# Patient Record
Sex: Male | Born: 1960 | Race: Black or African American | Hispanic: No | Marital: Married | Smoking: Never smoker
Health system: Southern US, Community
[De-identification: ages and names within clinical notes are randomized; demographics above are authoritative.]

## PROBLEM LIST (undated history)

## (undated) DIAGNOSIS — H9192 Unspecified hearing loss, left ear: Secondary | ICD-10-CM

## (undated) DIAGNOSIS — I10 Essential (primary) hypertension: Secondary | ICD-10-CM

## (undated) DIAGNOSIS — K219 Gastro-esophageal reflux disease without esophagitis: Secondary | ICD-10-CM

## (undated) DIAGNOSIS — G4733 Obstructive sleep apnea (adult) (pediatric): Secondary | ICD-10-CM

## (undated) HISTORY — PX: APPENDECTOMY: SHX54

## (undated) HISTORY — PX: CHOLECYSTECTOMY: SHX55

---

## 1998-01-21 ENCOUNTER — Emergency Department (HOSPITAL_COMMUNITY): Admission: EM | Admit: 1998-01-21 | Discharge: 1998-01-21 | Payer: Self-pay | Admitting: Emergency Medicine

## 1998-05-11 ENCOUNTER — Emergency Department (HOSPITAL_COMMUNITY): Admission: EM | Admit: 1998-05-11 | Discharge: 1998-05-11 | Payer: Self-pay | Admitting: Emergency Medicine

## 1999-10-05 ENCOUNTER — Emergency Department (HOSPITAL_COMMUNITY): Admission: EM | Admit: 1999-10-05 | Discharge: 1999-10-05 | Payer: Self-pay | Admitting: Emergency Medicine

## 2000-09-14 ENCOUNTER — Encounter: Payer: Self-pay | Admitting: Emergency Medicine

## 2000-09-14 ENCOUNTER — Emergency Department (HOSPITAL_COMMUNITY): Admission: EM | Admit: 2000-09-14 | Discharge: 2000-09-14 | Payer: Self-pay | Admitting: Emergency Medicine

## 2003-04-01 ENCOUNTER — Emergency Department (HOSPITAL_COMMUNITY): Admission: EM | Admit: 2003-04-01 | Discharge: 2003-04-01 | Payer: Self-pay | Admitting: *Deleted

## 2003-04-01 ENCOUNTER — Encounter: Payer: Self-pay | Admitting: *Deleted

## 2004-12-14 ENCOUNTER — Emergency Department (HOSPITAL_COMMUNITY): Admission: EM | Admit: 2004-12-14 | Discharge: 2004-12-14 | Payer: Self-pay | Admitting: *Deleted

## 2005-02-07 ENCOUNTER — Ambulatory Visit: Payer: Self-pay | Admitting: Internal Medicine

## 2005-02-07 ENCOUNTER — Inpatient Hospital Stay (HOSPITAL_COMMUNITY): Admission: EM | Admit: 2005-02-07 | Discharge: 2005-02-11 | Payer: Self-pay | Admitting: Emergency Medicine

## 2005-02-08 ENCOUNTER — Encounter (INDEPENDENT_AMBULATORY_CARE_PROVIDER_SITE_OTHER): Payer: Self-pay | Admitting: *Deleted

## 2005-02-09 ENCOUNTER — Ambulatory Visit: Payer: Self-pay | Admitting: Cardiology

## 2005-02-17 ENCOUNTER — Ambulatory Visit: Payer: Self-pay | Admitting: Internal Medicine

## 2005-02-23 ENCOUNTER — Ambulatory Visit: Payer: Self-pay | Admitting: Cardiology

## 2008-04-28 ENCOUNTER — Emergency Department (HOSPITAL_COMMUNITY): Admission: EM | Admit: 2008-04-28 | Discharge: 2008-04-28 | Payer: Self-pay | Admitting: Family Medicine

## 2010-03-01 ENCOUNTER — Inpatient Hospital Stay (HOSPITAL_COMMUNITY): Admission: EM | Admit: 2010-03-01 | Discharge: 2010-03-03 | Payer: Self-pay | Admitting: Emergency Medicine

## 2010-03-01 ENCOUNTER — Ambulatory Visit: Payer: Self-pay | Admitting: Internal Medicine

## 2010-03-19 DIAGNOSIS — I10 Essential (primary) hypertension: Secondary | ICD-10-CM | POA: Insufficient documentation

## 2010-03-19 DIAGNOSIS — E785 Hyperlipidemia, unspecified: Secondary | ICD-10-CM | POA: Insufficient documentation

## 2010-10-20 ENCOUNTER — Inpatient Hospital Stay (INDEPENDENT_AMBULATORY_CARE_PROVIDER_SITE_OTHER)
Admission: RE | Admit: 2010-10-20 | Discharge: 2010-10-20 | Disposition: A | Discharge status: Home / Self Care | Payer: BC Managed Care – PPO | Source: Ambulatory Visit | Attending: Family Medicine | Admitting: Family Medicine

## 2010-10-20 DIAGNOSIS — R42 Dizziness and giddiness: Secondary | ICD-10-CM

## 2010-10-20 LAB — POCT I-STAT, CHEM 8
Calcium, Ion: 1.22 mmol/L (ref 1.12–1.32)
Chloride: 104 mEq/L (ref 96–112)
Creatinine, Ser: 1.4 mg/dL (ref 0.4–1.5)
Glucose, Bld: 97 mg/dL (ref 70–99)
Sodium: 140 mEq/L (ref 135–145)

## 2010-11-15 LAB — COMPREHENSIVE METABOLIC PANEL
BUN: 15 mg/dL (ref 6–23)
CO2: 27 mEq/L (ref 19–32)
GFR calc Af Amer: >60 mL/min (ref >60)
Glucose, Bld: 105 mg/dL — ABNORMAL HIGH (ref 70–99)
Potassium: 4.1 mEq/L (ref 3.5–5.1)
Sodium: 141 mEq/L (ref 135–145)
Total Protein: 7.2 g/dL (ref 6.0–8.3)

## 2010-11-15 LAB — BASIC METABOLIC PANEL
BUN: 13 mg/dL (ref 6–23)
CO2: 27 mEq/L (ref 19–32)
Calcium: 8.9 mg/dL (ref 8.4–10.5)
Creatinine, Ser: 1.14 mg/dL (ref 0.4–1.5)
GFR calc non Af Amer: >60 mL/min (ref >60)
Glucose, Bld: 112 mg/dL — ABNORMAL HIGH (ref 70–99)
Glucose, Bld: 112 mg/dL — ABNORMAL HIGH (ref 70–99)
Potassium: 4 mEq/L (ref 3.5–5.1)
Sodium: 139 mEq/L (ref 135–145)
Sodium: 140 mEq/L (ref 135–145)

## 2010-11-15 LAB — CBC
Hemoglobin: 13.1 g/dL (ref 13.0–17.0)
MCH: 27.5 pg (ref 26.0–34.0)
MCV: 83.6 fL (ref 78.0–100.0)
MCV: 83.9 fL (ref 78.0–100.0)
Platelets: 247 10*3/uL (ref 150–400)
RBC: 4.55 MIL/uL (ref 4.22–5.81)
RDW: 13.7 % (ref 11.5–15.5)
RDW: 14.1 % (ref 11.5–15.5)
WBC: 7.4 10*3/uL (ref 4.0–10.5)
WBC: 8 10*3/uL (ref 4.0–10.5)

## 2010-11-15 LAB — DIFFERENTIAL
Basophils Absolute: 0.1 10*3/uL (ref 0.0–0.1)
Basophils Relative: 1 % (ref 0–1)
Eosinophils Relative: 2 % (ref 0–5)
Lymphs Abs: 3.4 10*3/uL (ref 0.7–4.0)
Monocytes Relative: 7 % (ref 3–12)
Neutrophils Relative %: 47 % (ref 43–77)

## 2010-11-15 LAB — APTT: aPTT: 83 seconds — ABNORMAL HIGH (ref 24–37)

## 2010-11-15 LAB — CK TOTAL AND CKMB (NOT AT ARMC): CK, MB: 5.1 ng/mL — ABNORMAL HIGH (ref 0.3–4.0)

## 2010-11-15 LAB — LIPASE, BLOOD: Lipase: 34 U/L (ref 11–59)

## 2010-11-15 LAB — PROTIME-INR
INR: 1.06 (ref 0.00–1.49)
Prothrombin Time: 13.7 seconds (ref 11.6–15.2)

## 2010-11-15 LAB — TROPONIN I: Troponin I: 0.03 ng/mL (ref 0.00–0.06)

## 2010-11-15 LAB — POCT CARDIAC MARKERS
CKMB, poc: 1.7 ng/mL (ref 1.0–8.0)
Troponin i, poc: <0.05 ng/mL (ref 0.00–0.09)

## 2010-11-15 LAB — TSH: TSH: 1.015 u[IU]/mL (ref 0.350–4.500)

## 2011-01-15 NOTE — Consult Note (Signed)
Blake Wolfe, Blake Wolfe              ACCOUNT NO.:  [id number]   MEDICAL RECORD NO.:  [id number]          PATIENT TYPE:  INP   LOCATION:  3733                         FACILITY:  [facility]   PHYSICIAN:  Doylene Canning. Ladona Ridgel, M.D.  DATE OF BIRTH:  11/15/1960   DATE OF CONSULTATION:  02/11/2005  DATE OF DISCHARGE:                                   CONSULTATION   CONSULTATION REQUESTED BY:  Dr. Bonnee Quin   INDICATION FOR CONSULTATION:  Evaluation of recurrent unexplained syncope.   HISTORY OF PRESENT ILLNESS:  The patient is a 50 year old man with a history  of hypertension and dyslipidemia.  He was admitted to the hospital after  having a near syncopal episode.  He gives a history which initially began  seven years ago.  At that time, he had been sleeping and got up and suddenly  became light-headed and passed out.  There is a brief warning with this  episode, but he had no incontinence.  He had no episodes until one month  ago, when he was awakened during sleep and went to the bathroom.  Immediately after urinating, he stood up to go back to the bedroom and  became dizzy and light headed and passed out.  No one actually saw the  patient passed out.  However, he found himself on the floor and his wife  stated that she heard him fall to the floor and did not answer when she  called.  He did not lose control of his bowel or bladder with his syncopal  episode and did not bite his tongue.  There was only a brief warning that  something was wrong.  After he passed out, he awoke fairly quickly and did  not feel particularly bad.  The most recent episode did not result in frank  syncope, but near syncope occurred on the day prior to admission.  Again, he  was shaving in his bathroom and began to feel dizzy and light headed and  subsequently sat down and avoided passing out.  He had no post nausea or  vomiting.  The spell was avoided by sitting down.  He decided to present for  additional evaluation.   The patient does have a history of chest tightness  and subsequently underwent catheterization by Dr. Riley Kill, demonstrating  normal coronary arteries and normal LV systolic function.  His  echocardiogram demonstrated no evidence of hypertrophic cardiomyopathy   PAST MEDICAL HISTORY:  1.  Hypertension.  2.  Dyslipidemia.   FAMILY HISTORY:  Negative for sudden cardiac death.  His mother died at age  80 of complications of diabetes and hypertension and coronary disease.  His  father died at age 39 of complications of prostate cancer.  He had 13  siblings, none of whom have died suddenly.   SOCIAL HISTORY:  The patient lives in [place] and is married.  He works  as a Naval architect.  He drinks alcohol socially and denies tobacco use.  He  quit smoking in February of this year.   REVIEW OF SYSTEMS:  Negative for fever, chills and night seats.  He  has no  vision or hearing problems.  No skin rashes or other problems.  No shortness  of breath, orthopnea or PND.  He has chest pain as previously described.  He  denies dysuria, hematuria or nocturia.  He denies weakness, numbness,  depression and anxiety.  He denies nausea, vomiting, diarrhea and  constipation.  He does have reflux symptoms.  He denies polyuria,  polydipsia, heat or cold intolerance.  He denies arthralgias and arthritis.   PHYSICAL EXAMINATION:  GENERAL:  He is a pleasant, well-appearing man in no  distress.  VITAL SIGNS:  Blood pressure 121/83, pulse 80 and regular, respirations 20,  temperature 98.  HEENT:  Exam is normocephalic and atraumatic.  Pupils equal and round.  Oropharynx is moist.  Sclerae are anicteric.  NECK:  No jugular venous distention.  There is no thyromegaly.  The trachea  is midline.  The carotids are 2+ and symmetric.  There is no adenopathy.  LUNGS:  Clear bilaterally to auscultation.  There are no wheezes, rales or  rhonchi present.  There is no increased work of breathing.  CARDIOVASCULAR:   Regular rate and rhythm, normal S1 and S2.  There are no  murmurs, rubs or gallops present.  ABDOMEN:  Soft, nontender and nondistended.  There is no organomegaly.  EXTREMITIES:  Demonstrate no clubbing, cyanosis or edema.  NEUROLOGIC:  Alert and oriented x3 with cranial nerves intact.  Strength was  5/5 and symmetric.   His EKG demonstrates sinus rhythm with normal axis and intervals.   IMPRESSION:  1.  Recurrent unexplained syncope.  2.  Hypertension.  3.  Dyslipidemia.   DISCUSSION:  The most likely episode of the patient's symptoms are neurally  mediated.  He has no evidence of structural heart disease, no family history  of sudden cardiac death, and no evidence of significant QT prolongation on  his EKG.  I have recommended that he increase his salt and fluid intake,  that he have frequent meals and that he sits down or lies down when a spell  occurs.  If the all of above do no control his symptoms, then a trial of  Florinef plus or minus a centrally acting agent like Zoloft or Lexapro will  be a consideration. I plan to see the patient back in the office in six to  eight weeks.       GWT/MEDQ  D:  02/11/2005  T:  02/11/2005  Job:  [id number]   cc:   Arturo Morton. Riley Kill, M.D. [facility]   Ileana Roup, M.D.  [address], [place] [address]  Fax: [phone]

## 2011-01-15 NOTE — Cardiovascular Report (Signed)
Blake Wolfe, Blake Wolfe              ACCOUNT NO.:  [id number]   MEDICAL RECORD NO.:  [id number]          PATIENT TYPE:  INP   LOCATION:  3733                         FACILITY:  [facility]   PHYSICIAN:  Arturo Morton. Riley Kill, M.D. [facility] OF BIRTH:  [birth year]   DATE OF PROCEDURE:  02/10/2005  DATE OF DISCHARGE:                              CARDIAC CATHETERIZATION   INDICATIONS:  Blake Wolfe is a 50 year old gentleman who presents with  some exertional chest discomfort, but also syncope. His enzymes were  generally negative with all negative troponins. There was one borderline CK-  MB. The episodes of syncope sound as if they could be neurally mediated.  Because of the recurrent episodes of chest pain and the progression of  symptoms over the past month, cardiac catheterization was felt to be  indicated. Importantly, the patient had a D-dimer of less than 0.22. The  chest x-ray revealed borderline cardiac enlargement with no acute disease.  The patient has had prior cholecystectomy. Risks, benefits, and alternatives  were discussed with the patient, and he consented to proceed.   PROCEDURE:  1.  Left heart catheterization  2.  Selective coronary arteriography.  3.  Selective left ventriculography.   DESCRIPTION OF PROCEDURE:  The patient was brought to the catheterization  laboratory and prepped and draped in usual fashion. Through an anterior  puncture, the right femoral artery was entered on the first stick. A 6-  French sheath was then placed. Central aortic and left ventricular pressures  were measured with a pigtail catheter. Ventriculography was then performed  in the RAO projection without complication. Following pressure pullback, the  pigtail catheter was removed. Coronary arteriography of the left coronary  artery was then performed without difficulty. Coronary arteriography of the  right coronary was without major difficulty, but with there was damping with  entry of the right  ostium probably due to spasm. Intra-aortic nitroglycerin  was then administered. We took additional views. Following this, I actually  put a guiding catheter into the right coronary to better demonstrate this  area. Multiple views were obtained just outside the ostium. There was no  significant complication from the procedure, and he was taken to the holding  area in satisfactory clinical condition.   HEMODYNAMIC DATA:  1.  Central aortic pressure 118/81.  2.  Left ventricular pressure 129/11.  3.  No gradient on pullback across aortic valve.   ANGIOGRAPHIC DATA:  1.  Ventriculography was performed in the RAO projection. Overall systolic      function was preserved. No definite wall motion abnormalities were seen.      Ejection fraction was calculated in the RAO projection at 62%.  2.  The left main is free of critical disease.  3.  The left anterior descending artery courses to the apex. There two      smaller diagonal branches. Other than minimal luminal irregularity, the      LAD is free of critical disease.  4.  The circumflex provides a proximal marginal branch and then divides into      an AV circumflex which provides a posterolateral system and  a fairly      large second obtuse marginal branch. The circumflex system appears to be      relatively smooth throughout. No obvious evidence of obstruction is      identified.  5.  The right coronary artery is a relatively small vessel providing a      single PDA. There was some ostial spasm noted which appeared to be      relieved by intracoronary nitroglycerin. The right coronary artery      itself appeared to be smooth throughout. Because of the proximal spasm,      there is perhaps 20 to 30% narrowing at the ostium, but this did not      appear to be high grade or flow limiting.   CONCLUSIONS:  1.  Preserved left ventricular function.  2.  No evidence of high-grade obstructive disease.  3.  Mild ostial spasm of the right coronary  during injection.   DISPOSITION:  I spoke with Dr. Lewayne Bunting.  EP consult will be obtained.       TDS/MEDQ  D:  02/10/2005  T:  02/10/2005  Job:  [id number]   cc:   Doylene Canning. Ladona Ridgel, M.D.   [facility]   Ileana Roup, M.D.  [address], [place] [address]  Fax: [phone]

## 2011-01-15 NOTE — Consult Note (Signed)
NAMECORTLIN, Blake Wolfe              ACCOUNT NO.:  [id number]   MEDICAL RECORD NO.:  [id number]          PATIENT TYPE:  INP   LOCATION:  3733                         FACILITY:  [facility]   PHYSICIAN:  Blake Wolfe, M.D. [facility] OF BIRTH:  10-06-1960   DATE OF CONSULTATION:  02/09/2005  DATE OF DISCHARGE:                                   CONSULTATION   CHIEF COMPLAINT:  Passing out.   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 Over the past month  he has had about three separate episodes. During the first episode, he felt  weak and actually had what was described as a sinusitis. He apparently  received a course of penicillin. He was also thought to be dehydrated and  was hydrated. This gradually resolved. He then got up one night quickly  because he had to go to the bathroom. He then found himself down and he is  not quite sure how long he was out, but this occurred at the completion of  urination. The final episode occurred during shaving. He was shaving and  then became suddenly quite dizzy. He then sat down. Over the past two years  he has had some mild chest tightness. This chest tightness has actually  gotten worse over the past month. His car broke down recently and he had  about a three to four mile walk and he was feeling a fair amount of chest  tightness at the completion of this. In addition, he apparently has a heavy  hose which he says can bring on the  chest tightness. He says that it is a  deep heaviness and it is also associated with some shortness of breath. This  has been getting progressively worse. The patient is a smoker, but stopped  smoking in further evaluation of this year.   There is no prior history of diabetes or hypertension. He has a history of  tobacco use.   Prior to admission there were no medications.   He has no known drug allergies.   The patient has had  an appendectomy and cholecystectomy.   SOCIAL HISTORY:  He lives in [place] with his wife. He is a Naval architect.  He quit smoking in February 2006.   FAMILY HISTORY:  Remarkable for a mother who died at 67 and a father at 42.  There is no defined coronary heart disease.   REVIEW OF SYSTEMS:  Remarkable for some mild gastroesophageal reflux.   On admission, the patient had a head CT that was negative, ejection fraction  of 50% to 55% without a definite wall motion abnormality. The  electrocardiograms are reviewed, and initial electrocardiogram demonstrates  a normal sinus rhythm. There is mild ST elevation in I, aVL, V2,  I  __________  R wave progression, but may be due to lead placement, although  an anterior infarct could not be excluded.   His laboratory studies reveal a normal BUN and creatinine with potassium of  3.8, hemoglobin 13.6, hematocrit 41.2, and white count 5600.  The CKs have  been mildly elevated at 361, 305, and 285, but with normal MBs and  troponins. He has a cholesterol of 437, HDL 34.  TSH is normal.   PHYSICAL EXAMINATION:  GENERAL: He is an alert and oriented gentleman who  provides a clear history. He is well-developed and well-nourished with an  appropriate affect.  VITAL SIGNS: Blood pressure is 114/81.  HEENT: Extraocular muscles intact. Pupils equal, round, and reactive to  light and accommodation. No carotid bruits are appreciated.  NECK: Supple.  LUNGS: Clear to auscultation and percussion.  HEART: PMI is nondisplaced. Normal S1 and S2. No murmurs, rubs, or gallops  are appreciated.  EXTREMITIES: No significant edema.  NEUROLOGIC: Intact.   Chest x-ray reveals borderline cardiomegaly and no active disease.   1.  __________ etiology.  2.  Exertional chest tightness with some progression over the past month.   This patient has two specific problems. One is an episode of syncope or near  syncope, but it could be neurally mediated, but they have  been occurring in  the setting of exertional chest tightness. The possibility of underlying  coronary disease with possible right coronary involvement cannot be entirely  excluded. I think this needs to be excluded first. He was getting ready to  get on the treadmill today when Dr. Samule Ohm stopped his treadmill study.  Given the progression of symptoms I think he may need a tilt table test  given the findings. An absolute identifiable etiology is necessary given his  occupation.       TDS/MEDQ  D:  02/09/2005  T:  02/09/2005  Job:  [id number]

## 2011-01-15 NOTE — Discharge Summary (Signed)
Blake Wolfe, Blake Wolfe              ACCOUNT NO.:  [id number]   MEDICAL RECORD NO.:  [id number]          PATIENT TYPE:  INP   LOCATION:  3733                         FACILITY:  [facility]   PHYSICIAN:  Ileana Roup, M.D.  DATE OF BIRTH:  [birth year]   DATE OF ADMISSION:  02/07/2005  DATE OF DISCHARGE:  02/11/2005                                 DISCHARGE SUMMARY   DISCHARGE DIAGNOSIS:  1.  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  2.  Hypertension.  3.  Dyslipidemia.  4.  Chest tightness with coronary artery disease and normal left ventricular      function.  5.  Hyperglycemia.   DISCHARGE MEDICATIONS:  Tricor 54 mg 1 tab daily with meals.   FOLLOW UP:  The patient is to have follow up with Dr. Riley Kill at [facility] on [facility] on February 23, 2005, at 12:30.  The  patient is also to follow up with Dr. Ladona Ridgel in August 2006, the patient  will be called with an appointment.  The patient is also supposed to follow  up with Dr. Janee Morn at [facility] on February 17, 2005.  The  patient is to off work and return to work on February 25, 2005, with no heavy  lifting for the next two weeks.   DRIVING PRIVILEGES:  The patient's driving privileges are not to be taken  away as the patient does not experience syncope with sitting or lying down  per recommendations of the electrophysiologist, Dr. Lewayne Bunting.   PROCEDURES PERFORMED:  Chest x-ray was performed on February 07, 2005, and  showed borderline cardiac enlargement, no acute chest disease, stable exam.  Head CT was also performed on February 07, 2005, which showed no evidence of  acute intracranial abnormality, chronic sinus changes and interval sinus  surgery.  A 2D echo was also obtained on February 08, 2005, which showed overall  left ventricular systolic function at the lower limits of normal, left  ventricular EF estimated at 50-55%, left ventricular wall thickness was  mildly  increased.  Cardiac catheterization was performed on February 10, 2005,  which showed preserved left ventricular function, no evidence of high grade  obstructive disease, mild ostial spasm of the right coronary during  injection and the spasm had about 20-30% narrowing at the ostium, EF on  cardiac catheterization was 62%.  EKG was performed on February 07, 2005, which  showed normal sinus rhythm, no significant changes since last tracing, early  repolarization pattern, nonspecific T-wave abnormality.   CONSULTATIONS:  1.  Consultation was obtained [facility] with Dr. Riley Kill, February 09, 2005.  2.  Consultation was obtained with [facility] with Dr. Lewayne Bunting,      February 11, 2005.   HISTORY OF PRESENT ILLNESS:  Blake Wolfe is a 50 year old African  American male with a history of no significant past medical history who  presented to the emergency room with dizziness, episode of syncope with  micturition three days prior to admission.  The patient states that he had  been having some dizziness on and off for the past seven days prior to  admission.  The patient states that three days prior to admission while  micturating in the bathroom, he blacked out for approximately 20-30 seconds.  The patient states that his wife said he was shaking prior to that and  thought it may have been a seizure.  The patient states that on the morning  of admission while shaving he started to feel dizzy, sat down for about 5-10  minutes.  The patient states he felt OK and came to the emergency room to be  evaluated.  The patient reports a history of headache on admission, denies  any nausea, vomiting, diaphoresis, abdominal pain, shortness of breath, or  vertigo.  The patient also complains of some exertional chest pain across  his lower rib cage which he describes as a pressure and a tightness with no  radiation.  The patient states the onset of this was with his  syncope/presyncope  episodes.  The patient describes the chest pain as a 6-7  out of 10 and denies any radiation or any associated symptoms.  The patient  had no nausea or vomiting.   ALLERGIES:  No known drug allergies.   PAST MEDICAL HISTORY:  None significant.   CURRENT MEDICATIONS:  None.   PHYSICAL EXAMINATION:  Temperature 98.4, pulse 78, blood pressure 136/92,  respiratory rate 16, saturating 97% on room air.  Orthostatics, lying down  130/87, pulse 73, sitting 126/84, pulse 71, standing 134/89, pulse 76.  General:  The patient is a well developed, well nourished [place]  male in no apparent distress.  HEENT:  Normocephalic, atraumatic, pupils  equal, round, reactive to light, extraocular movements intact, oropharynx  clear and moist with no lesions and no exudates.  Neck supple with no  lymphadenopathy.  Lungs clear to auscultation bilaterally with good air  movement.  Heart is regular rate and rhythm, no rubs, gallops, murmurs.  No  JVD, no carotid bruits.  Abdomen is soft, nontender, nondistended, positive  bowel sounds, no hepatosplenomegaly.  Extremities:  No cyanosis, clubbing,  and edema.  Genitourinary:  Deferred.  Musculoskeletal:  5/5 upper extremity  strength bilaterally, 5/5 lower extremity strength bilaterally.  Neurological exam:  Cranial nerves 2-12 are grossly intact and nonfocal, no  pronator drift.  Psychiatric:  Appropriate affect, alert and oriented x 3,  no suicidal ideation, no homicidal ideation.   ADMISSION LABORATORY DATA:  White count 5.7, hemoglobin 13.5, hematocrit  41.5, RBC 4.96, MCV 81.6, RDW 13.7, platelets 273.  Absolute neutrophil  count 1.8, absolute lymphocytes 2.6, absolute monocytes 0.3, absolute  eosinophils 0.2, absolute basophils 0.  PT 12.7, INR 1, PTT 27.  D-dimer  less than 0.22.  Sodium 138, potassium 3.7, chloride 105, bicarb 26, glucose  107, BUN 12, creatinine 1.1, calcium 8.9.  Total protein 6.8, albumin 3.9. AST 21, ALT less than 8.   Alkaline phos 53, total bilirubin 0.9, direct  bilirubin 0.1.  Magnesium 2.2.  Amylase 77, lipase 23.  Hemoglobin A1C 5.9.  Cardiac enzymes:  First set CK 361, CK MB 4.5, troponin 0.02, second set CK  305, CK MB 3.2, troponin 0.01, third set CK 285, CK MB 2.8, troponin less  than 0.01.  Urine drug screen was negative.   HOSPITAL COURSE:  Problem 1:  Recurrent episodes of syncope/presyncope.  Blake Wolfe was  admitted to the hospital to be evaluated for his syncope/presyncope in the  setting of exertional chest pain.  It was felt this may be neurogenic  mediated versus cardiogenic.  Cardiac enzymes were obtained which were  negative to rule out MI.  A 2D echo was obtained with the results as stated  above to look for any structural abnormalities in his heart.  Cardiology was  also consulted during the hospitalization to help delineate a cardiogenic  source, as cardiac arrhythmia or structural heart disease could have been  part of the results of syncope/presyncope.  Seizure was in the differential  and was less likely as the patient denied any postictal confusion or tongue  biting or no incontinence.  Head CT was also negative.  Cardiology was  consulted, Dr. Riley Kill was initially consulted, a 2D echo was done and  showed EF between 52-55% with mildly increased left ventricular wall  thickness.  The patient was taken to the cardiac catheterization lab and  cardiac catheterization was done with results stated above which was  negative for any coronary artery disease.  Dr. Lewayne Bunting was also  contacted, who is an electrophysiologist with [facility], as well.  Please see consult note.  It was felt that the patient's symptoms were most  likely neurally mediated.  The patient was asymptomatic during  hospitalization, did not have any episodes of syncope or presyncope during  the hospitalization.  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  Dr. Lewayne Bunting was consulted on his driving  privileges and felt that the patient's driving privileges were not to be  taken away as the patient did not experience syncope with sitting or lying  down.  The patient improved throughout the hospitalization and remained in  stable condition, did not experience anymore episodes of syncope or  presyncope, nor any chest tightness.  The patient was improved and stable on  discharge and was discharged in stable condition.   Problem 2:  Hypertension.  This is likely thought to be secondary to stress  induced.  The patient's blood pressure in the hospital remained in the range  of 114 through 130s, felt to be stress induced.  The patient's blood  pressure was monitored and normalized during the hospitalization.  The  patient was asymptomatic during this time.   Problem 3:  Dyslipidemia.  During the hospitalization, the patient was risk  stratified.  A cholesterol panel was obtained and it was found that the patient had an elevated triglyceride level of 437 with LDL of 93.  The  patient was started on Tricor 54 mg daily and advised on some dietary  modifications for his dyslipidemia.  The patient was asymptomatic during the  hospitalization and stable.   Problem 4:  Hyperglycemia felt to be stress induced.  Hemoglobin A1C was  obtained which was 5.9.  The patient did have one episode of glucose levels  being in the 140s, but for the rest of the hospitalization, the patient's  blood glucose levels ranged from the 90s to the low 100s.  It was felt that  the patient would be monitored in terms of his blood glucose levels as an  outpatient for this.   Problem 5:  Chest tightness with no coronary artery disease and normal left  ventricular function.  The patient had a cardiac catheterization and had no  coronary artery disease.  2D echo was done and did not show any structural  heart disease.  The patient did not have any significant family history and  no  evidence on EKG.  The patient's chest tightness improved and the patient  had no episodes of chest tightness during his hospitalization.  It was felt  that the patient's chest tightness might just be pleuritic chest pain.  The  patient did not have any episodes of chest tightness during the  hospitalization.  The patient was stable in this aspect.  The patient was  discharged home in stable and improved condition.   DISCHARGE LABORATORY DATA:  White count 5.7, hemoglobin 13.1, hematocrit  39.6, platelet count 255, RDW 13.4, MCV 82.3, MCHC 33.  Sodium 134,  potassium 3.8, chloride 99, bicarb 27, BUN 12, creatinine 1.3, glucose 107,  calcium 9.  Hemoglobin A1C 5.9.  Patient with a direct LDL of 93.      Ramiro Harvest, MD    ______________________________  Ileana Roup, M.D.    DT/MEDQ  D:  05/12/2005  T:  05/12/2005  Job:  [id number]   cc:   Arturo Morton. Riley Kill, M.D. [facility]  [address]  [address]  [place]  [place] [address]   Doylene Canning. Ladona Ridgel, M.D.  [address]  [address]  [place]  [place] [address]

## 2013-09-27 ENCOUNTER — Encounter (HOSPITAL_COMMUNITY): Payer: Self-pay | Admitting: Emergency Medicine

## 2013-09-27 ENCOUNTER — Emergency Department (HOSPITAL_COMMUNITY)
Admission: EM | Admit: 2013-09-27 | Discharge: 2013-09-27 | Disposition: A | Discharge status: Home / Self Care | Payer: BC Managed Care – PPO | Source: Home / Self Care

## 2013-09-27 DIAGNOSIS — B9789 Other viral agents as the cause of diseases classified elsewhere: Secondary | ICD-10-CM

## 2013-09-27 DIAGNOSIS — R6889 Other general symptoms and signs: Secondary | ICD-10-CM

## 2013-09-27 DIAGNOSIS — B349 Viral infection, unspecified: Secondary | ICD-10-CM

## 2013-09-27 MED ORDER — GUAIFENESIN-CODEINE 100-10 MG/5ML PO SYRP: 5.0000 mL | ORAL_SOLUTION | ORAL | Status: DC | PRN

## 2013-09-27 MED ORDER — ONDANSETRON HCL 4 MG PO TABS: 4.0000 mg | ORAL_TABLET | Freq: Four times a day (QID) | ORAL | Status: DC

## 2013-09-27 NOTE — ED Notes (Signed)
pT  REPORTS  BODY  ACHES   FEVER  COUGH  AND  PAIN IN  CHEST FROM  COUGHING  THE  PATIENT  IS  SITTING  UPRIGHT ON EXAM TABLE  SPEAKING IN  COMPLETE  SENTANCES  AND  IS  IN NO  ACUTE  DISTRESS

## 2013-09-27 NOTE — ED Provider Notes (Signed)
Medical screening examination/treatment/procedure(s) were performed by non-physician practitioner and as supervising physician I was immediately available for consultation/collaboration.  Philipp Deputy, M.D.   Harden Mo, MD 09/27/13 [phone]

## 2013-09-27 NOTE — ED Provider Notes (Signed)
CSN: [id number]     Arrival date & time 09/27/13  1247 History   First MD Initiated Contact with Patient 09/27/13 1457     Chief Complaint  Patient presents with  . Generalized Body Aches   (Consider location/radiation/quality/duration/timing/severity/associated sxs/prior Treatment) HPI Comments: 53 year old male complaining of two-day history of frequent cough. The cough is associated with chest pain upon coughing. He also had a headache yesterday he and body aches. Had nausea but no vomiting or diarrhea. He felt like he had a fever yesterday but did not take it. Denies earache or sore throat.   History reviewed. No pertinent past medical history. History reviewed. No pertinent past surgical history. History reviewed. No pertinent family history. History  Substance Use Topics  . Smoking status: Not on file  . Smokeless tobacco: Not on file  . Alcohol Use: No    Review of Systems  Constitutional: Positive for fever, activity change and fatigue.  HENT: Positive for postnasal drip.   Respiratory: Positive for cough.   Cardiovascular: Positive for chest pain.  Gastrointestinal: Positive for nausea. Negative for vomiting and diarrhea.  Genitourinary: Negative.   Musculoskeletal: Positive for myalgias.    Allergies  Review of patient's allergies indicates no known allergies.  Home Medications   Current Outpatient Rx  Name  Route  Sig  Dispense  Refill  . guaiFENesin-codeine (CHERATUSSIN AC) 100-10 MG/5ML syrup   Oral   Take 5 mLs by mouth every 4 (four) hours as needed for cough or congestion.   120 mL   0    BP 136/89  Pulse 77  Temp(Src) 97.8 F (36.6 C) (Oral)  Resp 16  SpO2 98% Physical Exam  Nursing note and vitals reviewed. Constitutional: He is oriented to person, place, and time. He appears well-developed and well-nourished. No distress.  HENT:  Mouth/Throat: No oropharyngeal exudate.  Bilateral TMs are normal Oropharynx with streaky erythema but no  exudates. Clear PND  Eyes: Conjunctivae and EOM are normal.  Neck: Normal range of motion. Neck supple.  Cardiovascular: Normal rate, regular rhythm and normal heart sounds.   Pulmonary/Chest: Effort normal and breath sounds normal. No respiratory distress. He has no wheezes. He has no rales.  Musculoskeletal: Normal range of motion. He exhibits no edema.  Lymphadenopathy:    He has no cervical adenopathy.  Neurological: He is alert and oriented to person, place, and time.  Skin: Skin is warm and dry. No rash noted.  Psychiatric: He has a normal mood and affect.    ED Course  Procedures (including critical care time) Labs Review Labs Reviewed - No data to display Imaging Review No results found.    MDM   1. Viral syndrome   2. Flu-like symptoms      Drink plenty of fluids Alka-Seltzer cold plus nighttime Cheratussin a.c. Tylenol or 4 hours as needed Zofran 4 mg q. 6 hours when necessary nausea  Janne Napoleon, NP 09/27/13 1525

## 2013-09-27 NOTE — Discharge Instructions (Signed)
Fever, Adult A fever is a temperature of 100.4 F (38 C) or above.  HOME CARE  Take fever medicine as told by your doctor. Do not  take aspirin for fever if you are younger than 53 years of age.  If you are given antibiotic medicine, take it as told. Finish the medicine even if you start to feel better.  Rest.  Drink enough fluids to keep your pee (urine) clear or pale yellow. Do not drink alcohol.  Take a bath or shower with room temperature water. Do not use ice water or alcohol sponge baths.  Wear lightweight, loose clothes. GET HELP RIGHT AWAY IF:   You are short of breath or have trouble breathing.  You are very weak.  You are dizzy or you pass out (faint).  You are very thirsty or are making little or no urine.  You have new pain.  You throw up (vomit) or have watery poop (diarrhea).  You keep throwing up or having watery poop for more than 1 to 2 days.  You have a stiff neck or light bothers your eyes.  You have a skin rash.  You have a fever or problems (symptoms) that last for more than 2 to 3 days.  You have a fever and your problems quickly get worse.  You keep throwing up the fluids you drink.  You do not feel better after 3 days.  You have new problems. MAKE SURE YOU:   Understand these instructions.  Will watch your condition.  Will get help right away if you are not doing well or get worse. Document Released: 05/25/2008 Document Revised: 11/08/2011 Document Reviewed: 06/17/2011 [facility] Patient Information 2014 [place].  Viral Infections Alka Seltzer Cold Plus Night TIme  A viral infection can be caused by different types of viruses.Most viral infections are not serious and resolve on their own. However, some infections may cause severe symptoms and may lead to further complications. SYMPTOMS Viruses can frequently cause:  Minor sore throat.  Aches and pains.  Headaches.  Runny nose.  Different types of rashes.  Watery  eyes.  Tiredness.  Cough.  Loss of appetite.  Gastrointestinal infections, resulting in nausea, vomiting, and diarrhea. These symptoms do not respond to antibiotics because the infection is not caused by bacteria. However, you might catch a bacterial infection following the viral infection. This is sometimes called a "superinfection." Symptoms of such a bacterial infection may include:  Worsening sore throat with pus and difficulty swallowing.  Swollen neck glands.  Chills and a high or persistent fever.  Severe headache.  Tenderness over the sinuses.  Persistent overall ill feeling (malaise), muscle aches, and tiredness (fatigue).  Persistent cough.  Yellow, green, or brown mucus production with coughing. HOME CARE INSTRUCTIONS   Only take over-the-counter or prescription medicines for pain, discomfort, diarrhea, or fever as directed by your caregiver.  Drink enough water and fluids to keep your urine clear or pale yellow. Sports drinks can provide valuable electrolytes, sugars, and hydration.  Get plenty of rest and maintain proper nutrition. Soups and broths with crackers or rice are fine. SEEK IMMEDIATE MEDICAL CARE IF:   You have severe headaches, shortness of breath, chest pain, neck pain, or an unusual rash.  You have uncontrolled vomiting, diarrhea, or you are unable to keep down fluids.  You or your child has an oral temperature above 102 F (38.9 C), not controlled by medicine.  Your baby is older than 3 months with a rectal temperature of  102 F (38.9 C) or higher.  Your baby is 60 months old or younger with a rectal temperature of 100.4 F (38 C) or higher. MAKE SURE YOU:   Understand these instructions.  Will watch your condition.  Will get help right away if you are not doing well or get worse. Document Released: 05/26/2005 Document Revised: 11/08/2011 Document Reviewed: 12/21/2010 [facility] Patient Information 2014 [place], [place].  Upper  Respiratory Infection, Adult An upper respiratory infection (URI) is also sometimes known as the common cold. The upper respiratory tract includes the nose, sinuses, throat, trachea, and bronchi. Bronchi are the airways leading to the lungs. Most people improve within 1 week, but symptoms can last up to 2 weeks. A residual cough may last even longer.  CAUSES Many different viruses can infect the tissues lining the upper respiratory tract. The tissues become irritated and inflamed and often become very moist. Mucus production is also common. A cold is contagious. You can easily spread the virus to others by oral contact. This includes kissing, sharing a glass, coughing, or sneezing. Touching your mouth or nose and then touching a surface, which is then touched by another person, can also spread the virus. SYMPTOMS  Symptoms typically develop 1 to 3 days after you come in contact with a cold virus. Symptoms vary from person to person. They may include:  Runny nose.  Sneezing.  Nasal congestion.  Sinus irritation.  Sore throat.  Loss of voice (laryngitis).  Cough.  Fatigue.  Muscle aches.  Loss of appetite.  Headache.  Low-grade fever. DIAGNOSIS  You might diagnose your own cold based on familiar symptoms, since most people get a cold 2 to 3 times a year. Your caregiver can confirm this based on your exam. Most importantly, your caregiver can check that your symptoms are not due to another disease such as strep throat, sinusitis, pneumonia, asthma, or epiglottitis. Blood tests, throat tests, and X-rays are not necessary to diagnose a common cold, but they may sometimes be helpful in excluding other more serious diseases. Your caregiver will decide if any further tests are required. RISKS AND COMPLICATIONS  You may be at risk for a more severe case of the common cold if you smoke cigarettes, have chronic heart disease (such as heart failure) or lung disease (such as asthma), or if you  have a weakened immune system. The very young and very old are also at risk for more serious infections. Bacterial sinusitis, middle ear infections, and bacterial pneumonia can complicate the common cold. The common cold can worsen asthma and chronic obstructive pulmonary disease (COPD). Sometimes, these complications can require emergency medical care and may be life-threatening. PREVENTION  The best way to protect against getting a cold is to practice good hygiene. Avoid oral or hand contact with people with cold symptoms. Wash your hands often if contact occurs. There is no clear evidence that vitamin C, vitamin E, echinacea, or exercise reduces the chance of developing a cold. However, it is always recommended to get plenty of rest and practice good nutrition. TREATMENT  Treatment is directed at relieving symptoms. There is no cure. Antibiotics are not effective, because the infection is caused by a virus, not by bacteria. Treatment may include:  Increased fluid intake. Sports drinks offer valuable electrolytes, sugars, and fluids.  Breathing heated mist or steam (vaporizer or shower).  Eating chicken soup or other clear broths, and maintaining good nutrition.  Getting plenty of rest.  Using gargles or lozenges for comfort.  Controlling  fevers with ibuprofen or acetaminophen as directed by your caregiver.  Increasing usage of your inhaler if you have asthma. Zinc gel and zinc lozenges, taken in the first 24 hours of the common cold, can shorten the duration and lessen the severity of symptoms. Pain medicines may help with fever, muscle aches, and throat pain. A variety of non-prescription medicines are available to treat congestion and runny nose. Your caregiver can make recommendations and may suggest nasal or lung inhalers for other symptoms.  HOME CARE INSTRUCTIONS   Only take over-the-counter or prescription medicines for pain, discomfort, or fever as directed by your caregiver.  Use  a warm mist humidifier or inhale steam from a shower to increase air moisture. This may keep secretions moist and make it easier to breathe.  Drink enough water and fluids to keep your urine clear or pale yellow.  Rest as needed.  Return to work when your temperature has returned to normal or as your caregiver advises. You may need to stay home longer to avoid infecting others. You can also use a face mask and careful hand washing to prevent spread of the virus. SEEK MEDICAL CARE IF:   After the first few days, you feel you are getting worse rather than better.  You need your caregiver's advice about medicines to control symptoms.  You develop chills, worsening shortness of breath, or brown or red sputum. These may be signs of pneumonia.  You develop yellow or brown nasal discharge or pain in the face, especially when you bend forward. These may be signs of sinusitis.  You develop a fever, swollen neck glands, pain with swallowing, or white areas in the back of your throat. These may be signs of strep throat. SEEK IMMEDIATE MEDICAL CARE IF:   You have a fever.  You develop severe or persistent headache, ear pain, sinus pain, or chest pain.  You develop wheezing, a prolonged cough, cough up blood, or have a change in your usual mucus (if you have chronic lung disease).  You develop sore muscles or a stiff neck. Document Released: 02/09/2001 Document Revised: 11/08/2011 Document Reviewed: 12/18/2010 [facility] Patient Information 2014 [place], [place].

## 2014-04-17 ENCOUNTER — Encounter (HOSPITAL_COMMUNITY): Payer: Self-pay | Admitting: Emergency Medicine

## 2014-04-17 ENCOUNTER — Emergency Department (HOSPITAL_COMMUNITY)
Admission: EM | Admit: 2014-04-17 | Discharge: 2014-04-17 | Disposition: A | Discharge status: Nursing Home (Includes ICF) | Payer: BC Managed Care – PPO | Source: Home / Self Care | Attending: Emergency Medicine | Admitting: Emergency Medicine

## 2014-04-17 ENCOUNTER — Emergency Department (HOSPITAL_COMMUNITY)
Admission: EM | Admit: 2014-04-17 | Discharge: 2014-04-18 | Disposition: A | Discharge status: Home / Self Care | Payer: BC Managed Care – PPO | Attending: Emergency Medicine | Admitting: Emergency Medicine

## 2014-04-17 DIAGNOSIS — R079 Chest pain, unspecified: Secondary | ICD-10-CM | POA: Diagnosis not present

## 2014-04-17 DIAGNOSIS — R42 Dizziness and giddiness: Secondary | ICD-10-CM | POA: Insufficient documentation

## 2014-04-17 DIAGNOSIS — I10 Essential (primary) hypertension: Secondary | ICD-10-CM | POA: Diagnosis present

## 2014-04-17 DIAGNOSIS — R0789 Other chest pain: Secondary | ICD-10-CM

## 2014-04-17 LAB — CBC
HEMATOCRIT: 42.9 % (ref 39.0–52.0)
Hemoglobin: 14 g/dL (ref 13.0–17.0)
MCH: 26.8 pg (ref 26.0–34.0)
MCHC: 32.6 g/dL (ref 30.0–36.0)
MCV: 82 fL (ref 78.0–100.0)
PLATELETS: 289 10*3/uL (ref 150–400)
RBC: 5.23 MIL/uL (ref 4.22–5.81)
RDW: 13.7 % (ref 11.5–15.5)
WBC: 5.9 10*3/uL (ref 4.0–10.5)

## 2014-04-17 LAB — BASIC METABOLIC PANEL
ANION GAP: 13 (ref 5–15)
BUN: 13 mg/dL (ref 6–23)
CHLORIDE: 100 meq/L (ref 96–112)
CO2: 27 mEq/L (ref 19–32)
Calcium: 9.9 mg/dL (ref 8.4–10.5)
Creatinine, Ser: 1.13 mg/dL (ref 0.50–1.35)
GFR calc non Af Amer: 73 mL/min — ABNORMAL LOW (ref >90)
GFR, EST AFRICAN AMERICAN: 84 mL/min — AB (ref >90)
Glucose, Bld: 111 mg/dL — ABNORMAL HIGH (ref 70–99)
POTASSIUM: 3.9 meq/L (ref 3.7–5.3)
SODIUM: 140 meq/L (ref 137–147)

## 2014-04-17 LAB — I-STAT TROPONIN, ED: Troponin i, poc: 0 ng/mL (ref 0.00–0.08)

## 2014-04-17 NOTE — ED Notes (Signed)
Dizziness, high blood pressure, and lt. Sided cp. Cp increased with activity. Home bp checks 138-160/90-100.

## 2014-04-17 NOTE — ED Provider Notes (Signed)
CSN: [id number]     Arrival date & time 04/17/14  1724 History   First MD Initiated Contact with Patient 04/17/14 1810     Chief Complaint  Patient presents with  . Chest Pain  . Dizziness   (Consider location/radiation/quality/duration/timing/severity/associated sxs/prior Treatment) HPI Comments: 53 year old male presents for evaluation of dizziness. He has had constant, worsening dizziness that started 2 days ago. He describes this as feeling like he is going to fall out. The dizziness has been associated with chest pain, nausea, and shortness of breath on exertion. He describes the chest pain as feeling like a pulled muscle in his chest. It is worse with exertion and relieved at rest, although does not go a completely. It is located across his left chest and nonradiating. The shortness of breath is present on exertion and is relieved by rest. The nausea comes and goes and coincides with chest pain, he has not had any vomiting episodes. He also has noticed that the dizziness gets worse with exertion. he has no history of coronary artery disease, although he has been evaluated for chest pain in the past. Denies any weakness or numbness. He had a negative exercise stress test 7 years ago.  Patient is a 53 y.o. male presenting with chest pain and dizziness.  Chest Pain Associated symptoms: dizziness, nausea and shortness of breath   Associated symptoms: no palpitations and not vomiting   Dizziness Associated symptoms: chest pain, nausea and shortness of breath   Associated symptoms: no diarrhea, no palpitations and no vomiting     History reviewed. No pertinent past medical history. Past Surgical History  Procedure Laterality Date  . Cholecystectomy    . Appendectomy     No family history on file. History  Substance Use Topics  . Smoking status: Never Smoker   . Smokeless tobacco: Not on file  . Alcohol Use: No    Review of Systems  Respiratory: Positive for shortness of breath.  Negative for chest tightness.   Cardiovascular: Positive for chest pain. Negative for palpitations and leg swelling.  Gastrointestinal: Positive for nausea. Negative for vomiting and diarrhea.  Neurological: Positive for dizziness.  All other systems reviewed and are negative.    Allergies  Review of patient's allergies indicates no known allergies.  Home Medications   Prior to Admission medications   Medication Sig Start Date End Date Taking? Authorizing Provider  guaiFENesin-codeine (CHERATUSSIN AC) 100-10 MG/5ML syrup Take 5 mLs by mouth every 4 (four) hours as needed for cough or congestion. 09/27/13   Janne Napoleon, NP  ondansetron (ZOFRAN) 4 MG tablet Take 1 tablet (4 mg total) by mouth every 6 (six) hours. 09/27/13   Janne Napoleon, NP   BP 151/95  Pulse 77  Temp(Src) 98.3 F (36.8 C) (Oral)  Resp 20  SpO2 100% Physical Exam  Nursing note and vitals reviewed. Constitutional: He is oriented to person, place, and time. He appears well-developed and well-nourished. No distress.  HENT:  Head: Normocephalic.  Right Ear: Tympanic membrane, external ear and ear canal normal.  Left Ear: Tympanic membrane, external ear and ear canal normal.  Cardiovascular: Normal rate, regular rhythm and normal heart sounds.   Pulmonary/Chest: Effort normal and breath sounds normal. No respiratory distress.  Neurological: He is alert and oriented to person, place, and time. Coordination normal.  Skin: Skin is warm and dry. No rash noted. He is not diaphoretic.  Psychiatric: He has a normal mood and affect. Judgment normal.    ED Course  ED  EKG  Date/Time: 04/17/2014 6:45 PM Performed by: Allena Katz, H Authorized by: Allena Katz, H Rhythm: sinus rhythm Rate: normal QRS axis: normal Conduction: conduction normal ST Segments: ST segments normal T Waves: T waves normal Other: no other findings Clinical impression: normal ECG   (including critical care time) Labs Review Labs Reviewed - No  data to display  Imaging Review No results found.   MDM   1. Dizziness   2. Other chest pain    Dizziness associated with exertional chest pain, shortness of breath on exertion, nausea, and completely relieved by rest it is concerning for NSTEMI/unstable angina. Transferred to the emergency department for further evaluation    Liam Graham, PA-C 04/17/14 1846

## 2014-04-17 NOTE — ED Notes (Signed)
Pt. C/o dizziness and HTN starting yesterday, states it feels like the room is spinning. Also c/o chest pain, states it comes on with exertion. Denies chest pain at this time.

## 2014-04-17 NOTE — ED Provider Notes (Signed)
Medical screening examination/treatment/procedure(s) were performed by non-physician practitioner and as supervising physician I was immediately available for consultation/collaboration.  Philipp Deputy, M.D.  Harden Mo, MD 04/17/14 2229

## 2014-04-17 NOTE — ED Provider Notes (Signed)
CSN: [id number]     Arrival date & time 04/17/14  1903 History   First MD Initiated Contact with Patient 04/17/14 2306     Chief Complaint  Patient presents with  . Dizziness  . Chest Pain  . Hypertension     (Consider location/radiation/quality/duration/timing/severity/associated sxs/prior Treatment) Patient is a 53 y.o. male presenting with dizziness, chest pain, and hypertension. The history is provided by the patient.  Dizziness Associated symptoms: chest pain   Chest Pain Associated symptoms: dizziness   Hypertension Associated symptoms include chest pain.   Patient is here for evaluation of high blood pressure. He has taken his blood pressure at home, and found it to be in the range of 145/95 several times. He is otherwise fairly asymptomatic. He denies fever, chills, weakness, paresthesias, or problems walking or working. He has had a vague sensation of dizziness for 2 days. This does not cause problems with walking. 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 The chest pain always resolves when he stops pulling on a close. The sensation of pain , "feels like a muscle pulling." to the patient. He does not have associated symptoms with the chest pain. He was on hydrochlorothiazide, 10 years ago for hypertension, but stopped after 30 days. He, states that at that time. He had a cardiac evaluation with a stress test which was negative. He is not currently have a primary care provider. There are no other known modifying factors.   History reviewed. No pertinent past medical history. Past Surgical History  Procedure Laterality Date  . Cholecystectomy    . Appendectomy     History reviewed. No pertinent family history. History  Substance Use Topics  . Smoking status: Never Smoker   . Smokeless tobacco: Not on file  . Alcohol Use: No    Review of Systems  Cardiovascular:  Positive for chest pain.  Neurological: Positive for dizziness.  All other systems reviewed and are negative.     Allergies  Review of patient's allergies indicates no known allergies.  Home Medications   Prior to Admission medications   Medication Sig Start Date End Date Taking? Authorizing Provider  naproxen sodium (ANAPROX) 220 MG tablet Take 440 mg by mouth daily as needed (pain).   Yes Historical Provider, MD   BP 146/92  Pulse 54  Temp(Src) 99.1 F (37.3 C) (Oral)  Resp 17  Ht 5\' 8"  (1.727 m)  Wt 200 lb (90.719 kg)  BMI 30.42 kg/m2  SpO2 95% Physical Exam  Nursing note and vitals reviewed. Constitutional: He is oriented to person, place, and time. He appears well-developed and well-nourished.  HENT:  Head: Normocephalic and atraumatic.  Right Ear: External ear normal.  Left Ear: External ear normal.  Eyes: Conjunctivae and EOM are normal. Pupils are equal, round, and reactive to light.  Neck: Normal range of motion and phonation normal. Neck supple.  Cardiovascular: Normal rate, regular rhythm, normal heart sounds and intact distal pulses.   Pulmonary/Chest: Effort normal and breath sounds normal. He exhibits no bony tenderness.  Abdominal: Soft. There is no tenderness.  Musculoskeletal: Normal range of motion.  Neurological: He is alert and oriented to person, place, and time. No cranial nerve deficit or sensory deficit. He exhibits normal muscle tone. Coordination normal.  Skin: Skin is warm, dry and intact.  Psychiatric: He has a normal mood and affect. His behavior is  normal. Judgment and thought content normal.    ED Course  Procedures (including critical care time)  Findings discussed with the patient, questions answered.   Labs Review Labs Reviewed  BASIC METABOLIC PANEL - Abnormal; Notable for the following:    Glucose, Bld 111 (*)    GFR calc non Af Amer 73 (*)    GFR calc Af Amer 84 (*)    All other components within normal limits  CBC  I-STAT  TROPOININ, ED    Imaging Review No results found.   EKG Interpretation   Date/Time:  Wednesday April 17 2014 19:49:08 EDT Ventricular Rate:  75 PR Interval:  168 QRS Duration: 92 QT Interval:  372 QTC Calculation: 415 R Axis:   -5 Text Interpretation:  Normal sinus rhythm with sinus arrhythmia  Nonspecific T wave abnormality Abnormal ECG Since last tracing of earlier  today  a new nonspecific  ST abnormality has appeared Confirmed by Eulis Foster   MD, Talina Pleitez ([id number]) on 04/17/2014 11:13:51 PM      MDM   Final diagnoses:  None   Mild hypertension with minimal symptoms. Evaluation for endorgan damage is negative. I doubt intracranial bleeding, CVA, ACS, hypertensive, renal disease or metabolic instability.   Nursing Notes Reviewed/ Care Coordinated Applicable Imaging Reviewed Interpretation of Laboratory Data incorporated into ED treatment  The patient appears reasonably screened and/or stabilized for discharge and I doubt any other medical condition or other [facility] requiring further screening, evaluation, or treatment in the ED at this time prior to discharge.  Plan: Home Medications- HCTZ; Home Treatments- rest, DASH diet; return here if the recommended treatment, does not improve the symptoms; Recommended follow up- PCP of choice 1-2 weeks, to establish ongoing treatment    Richarda Blade, MD 04/18/14 0040

## 2014-04-17 NOTE — ED Notes (Signed)
Patient complains of dizziness and high blood pressure.  Reports onset 2 days ago.  Sitting , patient feels ok.  Reports when up moving around, increase in dizziness.  Reports taking blood pressure at home: 148/95, 161/100.  aslo reports feeling a pulled muscle sensation in left chest/under left breast/along rib cage

## 2014-04-18 MED ORDER — HYDROCHLOROTHIAZIDE 25 MG PO TABS: 25.0000 mg | ORAL_TABLET | Freq: Every day | ORAL | Status: DC

## 2014-04-18 NOTE — Discharge Instructions (Signed)
Start your blood pressure medication, tomorrow. Do not stop taking the blood pressure medication, and was advised to do so by a primary care doctor, or cardiologist. Avoid salt in your diet. Followup with a primary care doctor for further evaluation and treatment. Your Dr. repeat your EKG and consider further evaluation for cardiac disease. Return here if you develop worsening chest pain    Hypertension Hypertension, commonly called high blood pressure, is when the force of blood pumping through your arteries is too strong. Your arteries are the blood vessels that carry blood from your heart throughout your body. A blood pressure reading consists of a higher number over a lower number, such as 110/72. The higher number (systolic) is the pressure inside your arteries when your heart pumps. The lower number (diastolic) is the pressure inside your arteries when your heart relaxes. Ideally you want your blood pressure below 120/80. Hypertension forces your heart to work harder to pump blood. Your arteries may become narrow or stiff. Having hypertension puts you at risk for heart disease, stroke, and other problems.  RISK FACTORS Some risk factors for high blood pressure are controllable. Others are not.  Risk factors you cannot control include:   Race. You may be at higher risk if you are African American.  Age. Risk increases with age.  Gender. Men are at higher risk than women before age 68 years. After age 96, women are at higher risk than men. Risk factors you can control include:  Not getting enough exercise or physical activity.  Being overweight.  Getting too much fat, sugar, calories, or salt in your diet.  Drinking too much alcohol. SIGNS AND SYMPTOMS Hypertension does not usually cause signs or symptoms. Extremely high blood pressure (hypertensive crisis) may cause headache, anxiety, shortness of breath, and nosebleed. DIAGNOSIS  To check if you have hypertension, your health  care provider will measure your blood pressure while you are seated, with your arm held at the level of your heart. It should be measured at least twice using the same arm. Certain conditions can cause a difference in blood pressure between your right and left arms. A blood pressure reading that is higher than normal on one occasion does not mean that you need treatment. If one blood pressure reading is high, ask your health care provider about having it checked again. TREATMENT  Treating high blood pressure includes making lifestyle changes and possibly taking medicine. Living a healthy lifestyle can help lower high blood pressure. You may need to change some of your habits. Lifestyle changes may include:  Following the DASH diet. This diet is high in fruits, vegetables, and whole grains. It is low in salt, red meat, and added sugars.  Getting at least 2 hours of brisk physical activity every week.  Losing weight if necessary.  Not smoking.  Limiting alcoholic beverages.  Learning ways to reduce stress. If lifestyle changes are not enough to get your blood pressure under control, your health care provider may prescribe medicine. You may need to take more than one. Work closely with your health care provider to understand the risks and benefits. HOME CARE INSTRUCTIONS  Have your blood pressure rechecked as directed by your health care provider.   Take medicines only as directed by your health care provider. Follow the directions carefully. Blood pressure medicines must be taken as prescribed. The medicine does not work as well when you skip doses. Skipping doses also puts you at risk for problems.   Do not smoke.  Monitor your blood pressure at home as directed by your health care provider. SEEK MEDICAL CARE IF:   You think you are having a reaction to medicines taken.  You have recurrent headaches or feel dizzy.  You have swelling in your ankles.  You have trouble with your  vision. SEEK IMMEDIATE MEDICAL CARE IF:  You develop a severe headache or confusion.  You have unusual weakness, numbness, or feel faint.  You have severe chest or abdominal pain.  You vomit repeatedly.  You have trouble breathing. MAKE SURE YOU:   Understand these instructions.  Will watch your condition.  Will get help right away if you are not doing well or get worse. Document Released: 08/16/2005 Document Revised: 12/31/2013 Document Reviewed: 06/08/2013 [facility] Patient Information 2015 [place], [place]. This information is not intended to replace advice given to you by your health care provider. Make sure you discuss any questions you have with your health care provider.  DASH Eating Plan DASH stands for "Dietary Approaches to Stop Hypertension." The DASH eating plan is a healthy eating plan that has been shown to reduce high blood pressure (hypertension). Additional health benefits may include reducing the risk of type 2 diabetes mellitus, heart disease, and stroke. The DASH eating plan may also help with weight loss. WHAT DO I NEED TO KNOW ABOUT THE DASH EATING PLAN? For the DASH eating plan, you will follow these general guidelines:  Choose foods with a percent daily value for sodium of less than 5% (as listed on the food label).  Use salt-free seasonings or herbs instead of table salt or sea salt.  Check with your health care provider or pharmacist before using salt substitutes.  Eat lower-sodium products, often labeled as "lower sodium" or "no salt added."  Eat fresh foods.  Eat more vegetables, fruits, and low-fat dairy products.  Choose whole grains. Look for the word "whole" as the first word in the ingredient list.  Choose fish and skinless chicken or [place] more often than red meat. Limit fish, poultry, and meat to 6 oz (170 g) each day.  Limit sweets, desserts, sugars, and sugary drinks.  Choose heart-healthy fats.  Limit cheese to 1 oz (28 g) per  day.  Eat more home-cooked food and less restaurant, buffet, and fast food.  Limit fried foods.  Cook foods using methods other than frying.  Limit canned vegetables. If you do use them, rinse them well to decrease the sodium.  When eating at a restaurant, ask that your food be prepared with less salt, or no salt if possible. WHAT FOODS CAN I EAT? Seek help from a dietitian for individual calorie needs. Grains Whole grain or whole wheat bread. Brown rice. Whole grain or whole wheat pasta. Quinoa, bulgur, and whole grain cereals. Low-sodium cereals. Corn or whole wheat flour tortillas. Whole grain cornbread. Whole grain crackers. Low-sodium crackers. Vegetables Fresh or frozen vegetables (raw, steamed, roasted, or grilled). Low-sodium or reduced-sodium tomato and vegetable juices. Low-sodium or reduced-sodium tomato sauce and paste. Low-sodium or reduced-sodium canned vegetables.  Fruits All fresh, canned (in natural juice), or frozen fruits. Meat and Other Protein Products Ground beef (85% or leaner), grass-fed beef, or beef trimmed of fat. Skinless chicken or [place]. Ground chicken or [place]. Pork trimmed of fat. All fish and seafood. Eggs. Dried beans, peas, or lentils. Unsalted nuts and seeds. Unsalted canned beans. Dairy Low-fat dairy products, such as skim or 1% milk, 2% or reduced-fat cheeses, low-fat ricotta or cottage cheese, or plain low-fat yogurt.  Low-sodium or reduced-sodium cheeses. Fats and Oils Tub margarines without trans fats. Light or reduced-fat mayonnaise and salad dressings (reduced sodium). Avocado. Safflower, olive, or canola oils. Natural peanut or almond butter. Other Unsalted popcorn and pretzels. The items listed above may not be a complete list of recommended foods or beverages. Contact your dietitian for more options. WHAT FOODS ARE NOT RECOMMENDED? Grains White bread. White pasta. White rice. Refined cornbread. Bagels and croissants. Crackers that contain  trans fat. Vegetables Creamed or fried vegetables. Vegetables in a cheese sauce. Regular canned vegetables. Regular canned tomato sauce and paste. Regular tomato and vegetable juices. Fruits Dried fruits. Canned fruit in light or heavy syrup. Fruit juice. Meat and Other Protein Products Fatty cuts of meat. Ribs, chicken wings, bacon, sausage, bologna, salami, chitterlings, fatback, hot dogs, bratwurst, and packaged luncheon meats. Salted nuts and seeds. Canned beans with salt. Dairy Whole or 2% milk, cream, half-and-half, and cream cheese. Whole-fat or sweetened yogurt. Full-fat cheeses or blue cheese. Nondairy creamers and whipped toppings. Processed cheese, cheese spreads, or cheese curds. Condiments Onion and garlic salt, seasoned salt, table salt, and sea salt. Canned and packaged gravies. Worcestershire sauce. Tartar sauce. Barbecue sauce. Teriyaki sauce. Soy sauce, including reduced sodium. Steak sauce. Fish sauce. Oyster sauce. Cocktail sauce. Horseradish. Ketchup and mustard. Meat flavorings and tenderizers. Bouillon cubes. Hot sauce. Tabasco sauce. Marinades. Taco seasonings. Relishes. Fats and Oils Butter, stick margarine, lard, shortening, ghee, and bacon fat. Coconut, palm kernel, or palm oils. Regular salad dressings. Other Pickles and olives. Salted popcorn and pretzels. The items listed above may not be a complete list of foods and beverages to avoid. Contact your dietitian for more information. WHERE CAN I FIND MORE INFORMATION? National Heart, Lung, and Blood Institute: travelstabloid.com Document Released: 08/05/2011 Document Revised: 12/31/2013 Document Reviewed: 06/20/2013 [facility] Patient Information 2015 [place], [place]. This information is not intended to replace advice given to you by your health care provider. Make sure you discuss any questions you have with your health care provider.  Emergency Department Resource Guide 1) Find a  Doctor and Pay Out of Pocket Although you won't have to find out who is covered by your insurance plan, it is a good idea to ask around and get recommendations. You will then need to call the office and see if the doctor you have chosen will accept you as a new patient and what types of options they offer for patients who are self-pay. Some doctors offer discounts or will set up payment plans for their patients who do not have insurance, but you will need to ask so you aren't surprised when you get to your appointment.  2) Contact Your Local Health Department Not all health departments have doctors that can see patients for sick visits, but many do, so it is worth a call to see if yours does. If you don't know where your local health department is, you can check in your phone book. The CDC also has a tool to help you locate your state's health department, and many state websites also have listings of all of their local health departments.  3) Find a [facility] If your illness is not likely to be very severe or complicated, you may want to try a walk in clinic. These are popping up all over the country in pharmacies, drugstores, and shopping centers. They're usually staffed by nurse practitioners or physician assistants that have been trained to treat common illnesses and complaints. They're usually fairly quick and inexpensive. However, if  you have serious medical issues or chronic medical problems, these are probably not your best option.  No Primary Care Doctor: - Call Health Connect at  [phone] - they can help you locate a primary care doctor that  accepts your insurance, provides certain services, etc. - Physician Referral Service- [phone]  Chronic Pain Problems: Organization         Address  Phone   Notes  [facility]  [phone] Patients need to be referred by their primary care doctor.   Medication Assistance: Organization         Address  Phone    Notes  [facility] Medication [facility] [address] [place], [place] [address] [phone] --Must be a resident of [facility] -- Must have NO insurance coverage whatsoever (no Medicaid/ Medicare, etc.) -- The pt. MUST have a primary care doctor that directs their care regularly and follows them in the community   MedAssist  [phone]   [employer]  [phone]    Agencies that provide inexpensive medical care: Organization         Address  Phone   Notes  [place]  [phone]   [facility]    [phone]   [facility] [place], [place] [address] [phone]   [place] [address] [place] [phone]   Planned Parenthood    [phone]   [facility]    [phone]   [place] and [address], [place] Phone:  [phone], Fax:  [phone] Hours of Operation:  9 am - 6 pm, M-F.  Also accepts Medicaid/Medicare and self-pay.  [facility] for [place], [address], [place] Phone: [phone], Fax: [phone]. Hours of Operation:  8:30 am - 5:30 pm, M-F.  Also accepts Medicaid and self-pay.  [facility] [address], [place] Phone: [phone]   [place], [place], [place] [phone], Ext. [phone] Mondays & Thursdays: 7-9 AM.  First 15 patients are seen on a first come, first serve basis.    [place] Providers:  Organization         Address  Phone   Notes  [facility] [address], [place] [phone] Also accepts self-pay patients.  [facility] [address], [place]  [phone]   [place], [address], [place] [phone]   [facility] [address] [place] [phone]   Lucianne Lei [address], [place]   [phone] Only accepts [place] Access [place] patients after they have their name applied to their card.   Self-Pay (no insurance) in [facility]:  Organization         Address  Phone   Notes  Sickle Cell Patients, [facility] [place] [phone]   [facility] [place] [phone]   [facility] [place]  [place], [place], [place] [phone]   [facility]/Dr. Osei-Bonsu  [address] [place] or [address], [place] [phone] Phone number for both [facility] and   locations is the same.  [facility] [address] [place] [phone]   [facility] [address] [place] or [address] [phone]   [facility] [address], [place] [phone], phone; [phone], fax Sees patients 1st and 3rd Saturday of every month.  Must not qualify for public or private insurance (i.e. Medicaid, Medicare, [place] Health Choice, Veterans' Benefits)  Household income should be no more than 200% of the poverty level The clinic cannot treat you if you are pregnant or think you are pregnant  Sexually transmitted diseases are not treated at the clinic.    Dental Care: Organization         Address  Phone  Notes  [facility] Department of [facility] [place] [phone] Accepts children up to age 65 who are enrolled in [place] or [place]; pregnant women with a Medicaid card; and children who have applied for Medicaid or [place] Health Choice, but were declined, whose parents can pay a reduced fee at time of service.  [facility] Department of [facility]  [address], [place] [phone] Accepts children up to age 65 who are enrolled in [place] or [place]; pregnant women with a Medicaid card; and children who have applied for Medicaid or [place] Health Choice, but were declined, whose parents can pay a reduced fee at time of service.  [facility] [facility]  [place] [phone] Patients are seen by appointment only. Walk-ins are not accepted. [place] will see patients 85 years of age and older. Monday - Tuesday (8am-5pm) Most Wednesdays (8:30-5pm) $30 per visit, cash only  [facility] Adult Dental Access PROGRAM  [address], [facility] [phone] Patients are seen by appointment only. Walk-ins are not accepted. [place] will see patients 24 years of age and older. One Wednesday Evening (Monthly: Volunteer Based).  $30 per visit, cash only  [place]  [phone] for adults; Children under age 41, call Graduate Pediatric Dentistry at [phone]. Children aged 70-14, please call [phone] to request a pediatric application.  Dental services are provided in all areas of dental care including fillings, crowns and bridges, complete and partial dentures, implants, gum treatment, root canals, and extractions. Preventive care is also provided. Treatment is provided to both adults and children. Patients are selected via a lottery and there is often a waiting list.   [facility] [address] [place]  [phone] [email or web address]   [facility] [address] [place], [place] [phone], Ext. [phone] Second and Fourth Thursday of each month, opens at 6:30 AM; Clinic ends at 9 AM.  Patients are seen on a first-come first-served basis, and a limited number are seen during each clinic.   [facility]  [address] Hillard Danker [place], [place] [phone]   Eligibility Requirements You must have lived in  [place], [place], or [place] counties for at least the last three months.   You cannot be eligible for state or federal sponsored [employer], including [employer], [place], or [employer].   You generally cannot be eligible for healthcare insurance through your employer.    How to apply: Eligibility screenings are held every Tuesday and Wednesday afternoon from 1:00 pm until 4:00 pm. You do not  need an appointment for the interview!  [facility] [address] [place], [place]   [place]  [place] Department  [place]  [phone]    Behavioral Health Resources in the Community: Intensive Outpatient Programs Organization         Address  Phone  Notes  [place]. [address] [place], [place] [phone]   [facility] [address] [place], [place]   ADS: Alcohol & Drug Svcs [address], [place], [place]   [place] [address]  [place], [place] or [phone]   Substance Abuse Resources Organization         Address  Phone  Notes  Alcohol and Drug Services  [phone]   [place]  [phone]   [facility] [place]   Chinita Pester  [phone]   Residential & Outpatient Substance Abuse Program  [phone]   Psychological Services Organization         Address  Phone  Notes  [facility] [place]  [place]  [phone]   [place] [address] [place] or [phone]    Mobile Crisis Teams Organization         Address  Phone  Notes  [facility], Mobile Crisis Care Unit  [phone]   [facility]  [address] [place], [place]   [place] [address], [place] [phone]    Self-Help/Support Groups Organization         Address  Phone             Notes  [place]. of [place] - variety of support groups  [place] Call for more information  Narcotics Anonymous (NA), [facility] [address], [employer] [place]  2 meetings at this location   Special educational needs teacher         Address  Phone  Notes  [facility] [place],    [place]  [phone]   [facility]  [address] [place] [phone], [place], [place]   [place], [place] [phone] Admissions: 8am-3pm M-F  Incentives Substance [place] [address]    [place], [place] [phone]   [facility] [address] [place], [place], [place]   [facility] [address]  [place], [place]   [facility] - Intensive Outpatient [address] Kristeen [address], [place], [place]   [facility] ([place].) [place].,  [place], [place] [phone] or [phone]   [facility] (RTS) [address], [place], [place] Accepts Medicaid  [facility] [address]  [place] [phone] Substance Abuse/Addiction Treatment   [facility] Organization         Address  Phone  Notes  [facility]  [phone]   Domenic Schwab, PhD [address] Arlis Porta [place], [place]   [phone] or [phone]   [place], [place] [phone]   [place] [address] [place], [place] [phone] Insurance/Medicaid/sponsorship through [employer] and Families [address], [place] [address]  [place], [place] [phone] [place], [place] [phone]    Dr. Adele Schilder  [phone]   [facility] Dept. 1) [address] [place] 2) [place] 3)  [place] [address], [place] [phone]  [phone]   [place] [phone] or [phone] (After Hours)

## 2017-07-09 ENCOUNTER — Encounter: Payer: Self-pay | Admitting: Family Medicine

## 2018-10-14 ENCOUNTER — Other Ambulatory Visit: Payer: Self-pay

## 2018-10-14 ENCOUNTER — Encounter (HOSPITAL_COMMUNITY): Payer: Self-pay | Admitting: Emergency Medicine

## 2018-10-14 ENCOUNTER — Emergency Department (HOSPITAL_COMMUNITY)
Admission: EM | Admit: 2018-10-14 | Discharge: 2018-10-14 | Disposition: A | Discharge status: Home / Self Care | Payer: Self-pay | Attending: Emergency Medicine | Admitting: Emergency Medicine

## 2018-10-14 DIAGNOSIS — B029 Zoster without complications: Secondary | ICD-10-CM | POA: Insufficient documentation

## 2018-10-14 DIAGNOSIS — I10 Essential (primary) hypertension: Secondary | ICD-10-CM | POA: Insufficient documentation

## 2018-10-14 DIAGNOSIS — Z79899 Other long term (current) drug therapy: Secondary | ICD-10-CM | POA: Insufficient documentation

## 2018-10-14 MED ORDER — PREDNISONE 50 MG PO TABS: ORAL_TABLET | ORAL | 0 refills | Status: DC

## 2018-10-14 MED ORDER — TRAMADOL HCL 50 MG PO TABS: 50.0000 mg | ORAL_TABLET | Freq: Four times a day (QID) | ORAL | 0 refills | Status: DC | PRN

## 2018-10-14 MED ORDER — NAPROXEN 375 MG PO TABS: 375.0000 mg | ORAL_TABLET | Freq: Two times a day (BID) | ORAL | 0 refills | Status: DC

## 2018-10-14 MED ORDER — VALACYCLOVIR HCL 1 G PO TABS: 1000.0000 mg | ORAL_TABLET | Freq: Three times a day (TID) | ORAL | 0 refills | Status: DC

## 2018-10-14 NOTE — ED Triage Notes (Signed)
Pt c/o shingles type rash on R neck and R chest. Pt states it is painful and began several days ago.

## 2018-10-14 NOTE — Discharge Instructions (Addendum)
If the rash gets worse near you eye your will need to return here or see an eye doctor. Do not take the narcotic if driving as it will make you sleepy.

## 2018-10-14 NOTE — ED Provider Notes (Signed)
[facility] DEPT Provider Note   CSN: [id number] Arrival date & time: 10/14/18  1741     History   Chief Complaint Chief Complaint  Patient presents with  . Herpes Zoster    HPI Blake Wolfe is a 58 y.o. male who presents to the ED for a rash. Patient reports feeling burning in the right side of neck and chest several days ago followed by the rash. Patient denies any other problems.  HPI  History reviewed. No pertinent past medical history.  Patient Active Problem List   Diagnosis Date Noted  . DYSLIPIDEMIA 03/19/2010  . HYPERTENSION 03/19/2010    Past Surgical History:  Procedure Laterality Date  . APPENDECTOMY    . CHOLECYSTECTOMY          Home Medications    Prior to Admission medications   Medication Sig Start Date End Date Taking? Authorizing Provider  hydrochlorothiazide (HYDRODIURIL) 25 MG tablet Take 1 tablet (25 mg total) by mouth daily. 04/18/14   Daleen Bo, MD  naproxen (NAPROSYN) 375 MG tablet Take 1 tablet (375 mg total) by mouth 2 (two) times daily. 10/14/18   Ashley Murrain, NP  predniSONE (DELTASONE) 50 MG tablet Take one tablet PO daily starting 09/14/2018 10/14/18   Ashley Murrain, NP  traMADol (ULTRAM) 50 MG tablet Take 1 tablet (50 mg total) by mouth every 6 (six) hours as needed. 10/14/18   Ashley Murrain, NP  valACYclovir (VALTREX) 1000 MG tablet Take 1 tablet (1,000 mg total) by mouth 3 (three) times daily. 10/14/18   Ashley Murrain, NP    Family History No family history on file.  Social History Social History   Tobacco Use  . Smoking status: Never Smoker  Substance Use Topics  . Alcohol use: No  . Drug use: No     Allergies   Patient has no known allergies.   Review of Systems Review of Systems  Musculoskeletal: Positive for arthralgias.  Skin: Positive for rash.  All other systems reviewed and are negative.    Physical Exam Updated Vital Signs BP (!) 163/98 (BP Location: Left Arm)    Pulse 69   Temp 98.4 F (36.9 C) (Oral)   Resp 18   SpO2 99%   Physical Exam Vitals signs and nursing note reviewed.  Constitutional:      Appearance: He is well-developed.  HENT:     Head:      Comments: Two small areas on the face that do not appear vesicular.     Nose: Nose normal.  Eyes:     Extraocular Movements: Extraocular movements intact.     Conjunctiva/sclera: Conjunctivae normal.  Neck:     Musculoskeletal: Neck supple.  Cardiovascular:     Rate and Rhythm: Normal rate.  Pulmonary:     Effort: Pulmonary effort is normal.  Chest:       Comments: Vesicular rash to right side of neck and right chest wall. Musculoskeletal: Normal range of motion.  Skin:    General: Skin is warm and dry.  Neurological:     Mental Status: He is alert and oriented to person, place, and time.     Cranial Nerves: No cranial nerve deficit.  Psychiatric:        Mood and Affect: Mood normal.      ED Treatments / Results  Labs (all labs ordered are listed, but only abnormal results are displayed) Labs Reviewed - No data to display  Radiology No results found.  Procedures Procedures (including critical care time)  Medications Ordered in ED Medications - No data to display   Initial Impression / Assessment and Plan / ED Course  I have reviewed the triage vital signs and the nursing notes. SUBJECTIVE:  The patient has a 5 day history of a painful rash on the right side of neck and chest. PMH: generally healthy. Has not had herpes zoster in the past.  OBJECTIVE: Vital signs are normal, he appears well. Typical zoster lesions noted; vesicles on erythematous bases in clusters on the  in a dermatomal  Pattern at T1, T2.   ASSESSMENT: Herpes Zoster (shingles)  PLAN: The nature of herpes zoster is explained carefully. Intervention with antiviral agents is extremely helpful early in the course of the disease, less helpful after 2-3 days of symptoms. Postherpetic neuralgia is  explained; this may occur especially in the elderly despite every attempt at prevention. Prescription for valacyclovir (Valtrex), which may shorten the course of acute symptoms and reduce the incidence of later neuralgia. Short course of prednisone. Pain medication.The patient understands these issues, and will call as needed for further care.discussed with the patient that in the event that the rash on the face worsens he will need to return.    Final Clinical Impressions(s) / ED Diagnoses   Final diagnoses:  Herpes zoster without complication    ED Discharge Orders         Ordered    valACYclovir (VALTREX) 1000 MG tablet  3 times daily     10/14/18 1818    predniSONE (DELTASONE) 50 MG tablet     10/14/18 1818    traMADol (ULTRAM) 50 MG tablet  Every 6 hours PRN     10/14/18 1818    naproxen (NAPROSYN) 375 MG tablet  2 times daily     10/14/18 1818           Janit Bern [place], [place] 10/14/18 1831    Virgel Manifold, MD 10/15/18 [phone]

## 2019-07-01 ENCOUNTER — Emergency Department (HOSPITAL_BASED_OUTPATIENT_CLINIC_OR_DEPARTMENT_OTHER)
Admission: EM | Admit: 2019-07-01 | Discharge: 2019-07-02 | Disposition: A | Discharge status: Home / Self Care | Payer: Self-pay | Attending: Emergency Medicine | Admitting: Emergency Medicine

## 2019-07-01 ENCOUNTER — Emergency Department (HOSPITAL_BASED_OUTPATIENT_CLINIC_OR_DEPARTMENT_OTHER): Payer: Self-pay

## 2019-07-01 ENCOUNTER — Encounter (HOSPITAL_BASED_OUTPATIENT_CLINIC_OR_DEPARTMENT_OTHER): Payer: Self-pay | Admitting: Emergency Medicine

## 2019-07-01 ENCOUNTER — Other Ambulatory Visit: Payer: Self-pay

## 2019-07-01 DIAGNOSIS — R55 Syncope and collapse: Secondary | ICD-10-CM | POA: Insufficient documentation

## 2019-07-01 DIAGNOSIS — R079 Chest pain, unspecified: Secondary | ICD-10-CM | POA: Insufficient documentation

## 2019-07-01 DIAGNOSIS — Z79899 Other long term (current) drug therapy: Secondary | ICD-10-CM | POA: Insufficient documentation

## 2019-07-01 DIAGNOSIS — Y999 Unspecified external cause status: Secondary | ICD-10-CM | POA: Insufficient documentation

## 2019-07-01 DIAGNOSIS — Y92008 Other place in unspecified non-institutional (private) residence as the place of occurrence of the external cause: Secondary | ICD-10-CM | POA: Insufficient documentation

## 2019-07-01 DIAGNOSIS — I10 Essential (primary) hypertension: Secondary | ICD-10-CM | POA: Insufficient documentation

## 2019-07-01 DIAGNOSIS — Y9389 Activity, other specified: Secondary | ICD-10-CM | POA: Insufficient documentation

## 2019-07-01 DIAGNOSIS — S4991XA Unspecified injury of right shoulder and upper arm, initial encounter: Secondary | ICD-10-CM | POA: Insufficient documentation

## 2019-07-01 DIAGNOSIS — Z20828 Contact with and (suspected) exposure to other viral communicable diseases: Secondary | ICD-10-CM | POA: Insufficient documentation

## 2019-07-01 DIAGNOSIS — W07XXXA Fall from chair, initial encounter: Secondary | ICD-10-CM | POA: Insufficient documentation

## 2019-07-01 DIAGNOSIS — R0602 Shortness of breath: Secondary | ICD-10-CM | POA: Insufficient documentation

## 2019-07-01 LAB — CBC WITH DIFFERENTIAL/PLATELET
Abs Immature Granulocytes: 0.02 10*3/uL (ref 0.00–0.07)
Basophils Absolute: 0 10*3/uL (ref 0.0–0.1)
Basophils Relative: 0 %
Eosinophils Absolute: 0.2 10*3/uL (ref 0.0–0.5)
Eosinophils Relative: 4 %
HCT: 44.6 % (ref 39.0–52.0)
Hemoglobin: 14.1 g/dL (ref 13.0–17.0)
Immature Granulocytes: 0 %
Lymphocytes Relative: 43 %
Lymphs Abs: 2.9 10*3/uL (ref 0.7–4.0)
MCH: 27.1 pg (ref 26.0–34.0)
MCHC: 31.6 g/dL (ref 30.0–36.0)
MCV: 85.6 fL (ref 80.0–100.0)
Monocytes Absolute: 0.6 10*3/uL (ref 0.1–1.0)
Monocytes Relative: 9 %
Neutro Abs: 3 10*3/uL (ref 1.7–7.7)
Neutrophils Relative %: 44 %
Platelets: 268 10*3/uL (ref 150–400)
RBC: 5.21 MIL/uL (ref 4.22–5.81)
RDW: 13.7 % (ref 11.5–15.5)
WBC: 6.8 10*3/uL (ref 4.0–10.5)
nRBC: 0 % (ref 0.0–0.2)

## 2019-07-01 LAB — URINALYSIS, ROUTINE W REFLEX MICROSCOPIC
Bilirubin Urine: NEGATIVE
Glucose, UA: NEGATIVE mg/dL
Hgb urine dipstick: NEGATIVE
Ketones, ur: NEGATIVE mg/dL
Leukocytes,Ua: NEGATIVE
Nitrite: NEGATIVE
Protein, ur: NEGATIVE mg/dL
Specific Gravity, Urine: 1.015 (ref 1.005–1.030)
pH: 7 (ref 5.0–8.0)

## 2019-07-01 LAB — COMPREHENSIVE METABOLIC PANEL
ALT: 33 U/L (ref 0–44)
AST: 26 U/L (ref 15–41)
Albumin: 4.2 g/dL (ref 3.5–5.0)
Alkaline Phosphatase: 54 U/L (ref 38–126)
Anion gap: 10 (ref 5–15)
BUN: 15 mg/dL (ref 6–20)
CO2: 24 mmol/L (ref 22–32)
Calcium: 9.2 mg/dL (ref 8.9–10.3)
Chloride: 102 mmol/L (ref 98–111)
Creatinine, Ser: 1.06 mg/dL (ref 0.61–1.24)
GFR calc Af Amer: >60 mL/min (ref >60)
GFR calc non Af Amer: >60 mL/min (ref >60)
Glucose, Bld: 97 mg/dL (ref 70–99)
Potassium: 3.7 mmol/L (ref 3.5–5.1)
Sodium: 136 mmol/L (ref 135–145)
Total Bilirubin: 0.1 mg/dL — ABNORMAL LOW (ref 0.3–1.2)
Total Protein: 7.4 g/dL (ref 6.5–8.1)

## 2019-07-01 LAB — TROPONIN I (HIGH SENSITIVITY)
Troponin I (High Sensitivity): 4 ng/L (ref <18)
Troponin I (High Sensitivity): 5 ng/L (ref <18)

## 2019-07-01 LAB — RAPID URINE DRUG SCREEN, HOSP PERFORMED
Amphetamines: NOT DETECTED
Barbiturates: NOT DETECTED
Benzodiazepines: NOT DETECTED
Cocaine: NOT DETECTED
Opiates: NOT DETECTED
Tetrahydrocannabinol: NOT DETECTED

## 2019-07-01 LAB — CBG MONITORING, ED: Glucose-Capillary: 114 mg/dL — ABNORMAL HIGH (ref 70–99)

## 2019-07-01 LAB — ETHANOL: Alcohol, Ethyl (B): <10 mg/dL (ref <10)

## 2019-07-01 MED ORDER — ACETAMINOPHEN 325 MG PO TABS
650.0000 mg | ORAL_TABLET | Freq: Once | ORAL | Status: AC
  Administered 2019-07-01: 650 mg via ORAL
  Filled 2019-07-01: qty 2

## 2019-07-01 MED ORDER — IOHEXOL 350 MG/ML SOLN
75.0000 mL | Freq: Once | INTRAVENOUS | Status: AC | PRN
  Administered 2019-07-01: 75 mL via INTRAVENOUS

## 2019-07-01 NOTE — ED Triage Notes (Signed)
Pt fell in the house today, possible LOC, states he doesn't remember what happened. He hit the side of his head on the wall. Also c/o R shoulder pain.

## 2019-07-01 NOTE — ED Provider Notes (Signed)
[facility] EMERGENCY DEPARTMENT Provider Note   CSN: [id number] Arrival date & time: 07/01/19  1553     History   Chief Complaint Chief Complaint  Patient presents with   Fall   Loss of Consciousness    HPI Blake Wolfe is a 58 y.o. male.     HPI   Blake Wolfe is a 59 y.o. male, with a history of hyperlipidemia and HTN, presenting to the ED with syncopal episode that occurred around 2pm today.  States he got up from a sitting position, took a couple steps, and thinks he passed out. "The next thing I knew, my wife and granddaughter were trying to help me off the floor."  Over the last week he states he has had intermittent episodes of shortness of breath and chest discomfort. Resolves with rest after about 5-10 minutes, lower sternal, nonradiating, 5/10. Did not experience these symptoms today.  Initially, patient did indicate he has had the above symptoms for "a while," but then later in the ED course he gave more specifics on timing once his wife arrived. 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  He returned from a 2-week trip about 3 weeks ago.  Patient's wife states she was upstairs when the patient had his syncopal episode.  Their granddaughter came to get her and when she arrived at the patient's side, he was still lying on the ground and told her he did not know exactly what happened.  She did not witness any shaking or other seizure-like activity.  She states he did not seem to be confused. Not taking medication for blood pressure currently.  Denies alcohol or illicit drug use. Patient denies fever/chills, cough, shortness of breath at rest, chest pain at rest, diaphoresis, urinary/bowel incontinence, abdominal pain, numbness, weakness, N/V/D, lower extremity swelling/pain, or any other complaints.     History reviewed. No pertinent past medical history.  Patient Active Problem List   Diagnosis Date Noted    DYSLIPIDEMIA 03/19/2010   HYPERTENSION 03/19/2010    Past Surgical History:  Procedure Laterality Date   APPENDECTOMY     CHOLECYSTECTOMY          Home Medications    Prior to Admission medications   Medication Sig Start Date End Date Taking? Authorizing Provider  hydrochlorothiazide (HYDRODIURIL) 25 MG tablet Take 1 tablet (25 mg total) by mouth daily. 04/18/14   Daleen Bo, MD  naproxen (NAPROSYN) 375 MG tablet Take 1 tablet (375 mg total) by mouth 2 (two) times daily. 10/14/18   Ashley Murrain, NP  predniSONE (DELTASONE) 50 MG tablet Take one tablet PO daily starting 09/14/2018 10/14/18   Ashley Murrain, NP  traMADol (ULTRAM) 50 MG tablet Take 1 tablet (50 mg total) by mouth every 6 (six) hours as needed. 10/14/18   Ashley Murrain, NP  valACYclovir (VALTREX) 1000 MG tablet Take 1 tablet (1,000 mg total) by mouth 3 (three) times daily. 10/14/18   Ashley Murrain, NP    Family History Family History  Problem Relation Age of Onset   Hypertension Mother    Diabetes Mother    Kidney failure Mother    Prostate cancer Father 70    Social History Social History   Tobacco Use   Smoking status: Never Smoker   Smokeless tobacco: Never Used  Substance Use Topics   Alcohol use: No   Drug use: No     Allergies   Patient has no known  allergies.   Review of Systems Review of Systems  Constitutional: Negative for chills, diaphoresis and fever.  HENT: Negative for facial swelling.   Eyes: Negative for visual disturbance.  Respiratory: Negative for cough and shortness of breath.   Cardiovascular: Negative for chest pain and leg swelling.  Gastrointestinal: Negative for abdominal pain, diarrhea, nausea and vomiting.  Genitourinary: Negative for difficulty urinating, dysuria, flank pain, frequency and hematuria.  Musculoskeletal: Positive for back pain and neck pain.  Neurological: Positive for syncope and headaches. Negative for dizziness, weakness, light-headedness and  numbness.  All other systems reviewed and are negative.    Physical Exam Updated Vital Signs BP (!) 169/103 (BP Location: Left Arm)    Pulse 88    Temp 99.3 F (37.4 C) (Oral)    Resp 18    Ht 5\' 8"  (1.727 m)    Wt 97.5 kg    SpO2 100%    BMI 32.69 kg/m   Physical Exam Vitals signs and nursing note reviewed.  Constitutional:      General: He is not in acute distress.    Appearance: He is well-developed. He is not diaphoretic.  HENT:     Head: Normocephalic.     Comments: Some tenderness along the right side of the patient's scalp, especially in the parietal region.  No noted swelling, color change, wounds, deformity, or instability.  Tenderness along the right zygomatic region without swelling, deformity, color change, or instability.    Right Ear: Tympanic membrane, ear canal and external ear normal.     Left Ear: Tympanic membrane, ear canal and external ear normal.     Nose: Nose normal.     Mouth/Throat:     Mouth: Mucous membranes are moist.     Pharynx: Oropharynx is clear.     Comments: Dentition appears to be intact and stable.  No noted area of intraoral swelling.  No trismus or noted abnormal phonation.  Mouth opening to at least 3 finger widths.  Handles oral secretions without difficulty.  No noted facial swelling.  No sublingual swelling.  No swelling or tenderness to the submental or submandibular regions.  No swelling or tenderness into the soft tissues of the neck. Eyes:     Extraocular Movements: Extraocular movements intact.     Conjunctiva/sclera: Conjunctivae normal.     Pupils: Pupils are equal, round, and reactive to light.  Neck:     Musculoskeletal: Normal range of motion and neck supple.     Comments: Some mild tenderness along the midline cervical spine without swelling, deformity, signs of instability.  Tenderness extends into the right trapezius. Cardiovascular:     Rate and Rhythm: Normal rate and regular rhythm.     Pulses: Normal pulses.           Radial pulses are 2+ on the right side and 2+ on the left side.       Posterior tibial pulses are 2+ on the right side and 2+ on the left side.     Heart sounds: Normal heart sounds.     Comments: Tactile temperature in the extremities appropriate and equal bilaterally. Pulmonary:     Effort: Pulmonary effort is normal. No respiratory distress.     Breath sounds: Normal breath sounds.  Abdominal:     Palpations: Abdomen is soft.     Tenderness: There is no abdominal tenderness. There is no guarding.  Musculoskeletal:     Right lower leg: No edema.     Left lower leg:  No edema.     Comments: Tenderness over the right superior shoulder in the region of the [facility] joint with overlying abrasion, erythema, and mild swelling. No noted deformity, tenting, crepitus, or signs of instability to the right shoulder or along the right clavicle. He is able to touch the right hand to the left shoulder, though this movement is quite painful for him. He is able to perform most directions of movement of the right shoulder, however, he does have difficulty with abduction of the right shoulder past 90 degrees.  Overall trauma exam performed without any abnormalities noted other than those mentioned.  Lymphadenopathy:     Cervical: No cervical adenopathy.  Skin:    General: Skin is warm and dry.  Neurological:     Mental Status: He is alert and oriented to person, place, and time.     Comments: Sensation grossly intact to light touch in the extremities.  Grip strengths equal bilaterally.  Strength 5/5 in all extremities. No gait disturbance. Coordination intact. Cranial nerves III-XII grossly intact. No facial droop.   Sensation grossly intact to light touch through each of the nerve distributions of the bilateral upper extremities. Abduction and adduction of the fingers intact against resistance. Grip strength equal bilaterally. Supination and pronation intact against resistance. Strength 5/5 through the  cardinal directions of the bilateral wrists. Strength 5/5 with flexion and extension of the bilateral elbows. Patient can touch the thumb to each one of the fingertips without difficulty.  Patient can hold the "OK" sign against resistance.  Psychiatric:        Mood and Affect: Mood and affect normal.        Speech: Speech normal.        Behavior: Behavior normal.      ED Treatments / Results  Labs (all labs ordered are listed, but only abnormal results are displayed) Labs Reviewed  COMPREHENSIVE METABOLIC PANEL - Abnormal; Notable for the following components:      Result Value   Total Bilirubin 0.1 (*)    All other components within normal limits  CBG MONITORING, ED - Abnormal; Notable for the following components:   Glucose-Capillary 114 (*)    All other components within normal limits  NOVEL CORONAVIRUS, NAA (HOSP ORDER, SEND-OUT TO REF LAB; TAT 18-24 HRS)  URINALYSIS, ROUTINE W REFLEX MICROSCOPIC  CBC WITH DIFFERENTIAL/PLATELET  ETHANOL  RAPID URINE DRUG SCREEN, HOSP PERFORMED  TROPONIN I (HIGH SENSITIVITY)  TROPONIN I (HIGH SENSITIVITY)    EKG EKG Interpretation  Date/Time:  Sunday July 01 2019 16:05:22 EST Ventricular Rate:  88 PR Interval:  162 QRS Duration: 76 QT Interval:  378 QTC Calculation: 457 R Axis:   38 Text Interpretation: Normal sinus rhythm Nonspecific T wave abnormality Abnormal ECG No acute changes No significant change since last tracing [id number] is new Reconfirmed by Varney Biles [phone]) on 07/01/2019 8:15:44 PM   Radiology Dg Chest 2 View  Result Date: 07/01/2019 CLINICAL DATA:  Pain after fall EXAM: CHEST - 2 VIEW COMPARISON:  March 01, 2010 FINDINGS: The heart size and mediastinal contours are within normal limits. Both lungs are clear. The visualized skeletal structures are unremarkable. IMPRESSION: No soft tissue abnormality identified. Minimal anterior wedging of lower thoracic vertebral bodies appears unchanged since 2011. No obvious  acute fractures identified. Electronically Signed   By: Dorise Bullion III M.D   On: 07/01/2019 19:09   Dg Clavicle Right  Result Date: 07/01/2019 CLINICAL DATA:  Pain after fall.  Tenderness in [facility] joint.  EXAM: RIGHT CLAVICLE - 2+ VIEWS COMPARISON:  None. FINDINGS: There is no evidence of fracture or other focal bone lesions. Soft tissues are unremarkable. IMPRESSION: Negative. Electronically Signed   By: Dorise Bullion III M.D   On: 07/01/2019 19:10   Dg Shoulder Right  Result Date: 07/01/2019 CLINICAL DATA:  Fall, RIGHT shoulder pain. EXAM: RIGHT SHOULDER - 2+ VIEW COMPARISON:  None. FINDINGS: There is no evidence of fracture or dislocation. There is no evidence of arthropathy or other focal bone abnormality. Soft tissues are unremarkable. IMPRESSION: Negative. Electronically Signed   By: Franki Cabot M.D.   On: 07/01/2019 16:28   Ct Head Wo Contrast  Result Date: 07/01/2019 CLINICAL DATA:  S/p fall, right shoulder injury, pt unsure of LOC or head injury, states his granddaughter told him that he hit his head, pt denies headache or dizziness, denies blurred vision, walks with steady gait, pt states he "just doesn't feel like myself" EXAM: CT HEAD WITHOUT CONTRAST CT CERVICAL SPINE WITHOUT CONTRAST TECHNIQUE: Multidetector CT imaging of the head and cervical spine was performed following the standard protocol without intravenous contrast. Multiplanar CT image reconstructions of the cervical spine were also generated. COMPARISON:  Head CT, 12/14/2004. FINDINGS: CT HEAD FINDINGS Brain: No evidence of acute infarction, hemorrhage, hydrocephalus, extra-axial collection or mass lesion/mass effect. Vascular: No hyperdense vessel or unexpected calcification. Skull: Normal. Negative for fracture or focal lesion. Sinuses/Orbits: Globes and orbits are unremarkable. Chronic right maxillary sinus disease with mucosal thickening and wall thickening. Scattered mild ethmoid sinus mucosal thickening. Clear mastoid  air cells. Other: None. CT CERVICAL SPINE FINDINGS Alignment: Normal. Skull base and vertebrae: No acute fracture. No primary bone lesion or focal pathologic process. Soft tissues and spinal canal: No prevertebral fluid or swelling. No visible canal hematoma. Disc levels: Mild endplate spurring from C4, C5, C6 and C7. No significant disc bulging. No evidence of a disc herniation. Central spinal canal and neural foramina are widely patent. Upper chest: Negative. Other: None. IMPRESSION: HEAD CT 1. No intracranial abnormality. 2. Chronic right maxillary sinus disease. CERVICAL CT 1. No fracture or acute finding. 2. Minor anterior endplate vertebral spurring. No other abnormalities. Electronically Signed   By: Lajean Manes M.D.   On: 07/01/2019 19:11   Ct Angio Chest Pe W And/or Wo Contrast  Result Date: 07/01/2019 CLINICAL DATA:  Fall. Right shoulder pain. Pulmonary embolus suspected. EXAM: CT ANGIOGRAPHY CHEST WITH CONTRAST TECHNIQUE: Multidetector CT imaging of the chest was performed using the standard protocol during bolus administration of intravenous contrast. Multiplanar CT image reconstructions and MIPs were obtained to evaluate the vascular anatomy. CONTRAST:  64mL OMNIPAQUE IOHEXOL 350 MG/ML SOLN COMPARISON:  Chest x-ray today FINDINGS: Cardiovascular: Heart is mildly enlarged. Aorta is normal caliber. No filling defects in the pulmonary arteries to suggest pulmonary emboli. Mediastinum/Nodes: No mediastinal, hilar, or axillary adenopathy. Trachea and esophagus are unremarkable. Thyroid unremarkable. Lungs/Pleura: Lungs are clear. No focal airspace opacities or suspicious nodules. No effusions. Upper Abdomen: Imaging into the upper abdomen shows no acute findings. Prior cholecystectomy Musculoskeletal: Chest wall soft tissues are unremarkable. No acute bony abnormality. Review of the MIP images confirms the above findings. IMPRESSION: No evidence of pulmonary embolus. No acute cardiopulmonary disease.  Electronically Signed   By: Rolm Baptise M.D.   On: 07/01/2019 21:22   Ct Cervical Spine Wo Contrast  Result Date: 07/01/2019 CLINICAL DATA:  S/p fall, right shoulder injury, pt unsure of LOC or head injury, states his granddaughter told him that he hit his  head, pt denies headache or dizziness, denies blurred vision, walks with steady gait, pt states he "just doesn't feel like myself" EXAM: CT HEAD WITHOUT CONTRAST CT CERVICAL SPINE WITHOUT CONTRAST TECHNIQUE: Multidetector CT imaging of the head and cervical spine was performed following the standard protocol without intravenous contrast. Multiplanar CT image reconstructions of the cervical spine were also generated. COMPARISON:  Head CT, 12/14/2004. FINDINGS: CT HEAD FINDINGS Brain: No evidence of acute infarction, hemorrhage, hydrocephalus, extra-axial collection or mass lesion/mass effect. Vascular: No hyperdense vessel or unexpected calcification. Skull: Normal. Negative for fracture or focal lesion. Sinuses/Orbits: Globes and orbits are unremarkable. Chronic right maxillary sinus disease with mucosal thickening and wall thickening. Scattered mild ethmoid sinus mucosal thickening. Clear mastoid air cells. Other: None. CT CERVICAL SPINE FINDINGS Alignment: Normal. Skull base and vertebrae: No acute fracture. No primary bone lesion or focal pathologic process. Soft tissues and spinal canal: No prevertebral fluid or swelling. No visible canal hematoma. Disc levels: Mild endplate spurring from C4, C5, C6 and C7. No significant disc bulging. No evidence of a disc herniation. Central spinal canal and neural foramina are widely patent. Upper chest: Negative. Other: None. IMPRESSION: HEAD CT 1. No intracranial abnormality. 2. Chronic right maxillary sinus disease. CERVICAL CT 1. No fracture or acute finding. 2. Minor anterior endplate vertebral spurring. No other abnormalities. Electronically Signed   By: Lajean Manes M.D.   On: 07/01/2019 19:11     Procedures Procedures (including critical care time)  Medications Ordered in ED Medications  acetaminophen (TYLENOL) tablet 650 mg (650 mg Oral Given 07/01/19 2007)  iohexol (OMNIPAQUE) 350 MG/ML injection 75 mL (75 mLs Intravenous Contrast Given 07/01/19 2100)     Initial Impression / Assessment and Plan / ED Course  I have reviewed the triage vital signs and the nursing notes.  Pertinent labs & imaging results that were available during my care of the patient were reviewed by me and considered in my medical decision making (see chart for details).        Patient presents for evaluation following a syncopal episode. Patient is nontoxic appearing, afebrile, not tachycardic, not tachypneic, not hypotensive, maintains excellent SPO2 on room air, and is in no apparent distress.  Patient symptom-free prior to arrival in the ED.  No focal neuro deficits. Imaging studies without acute abnormalities. Lab work reassuring.  Delta troponins negative. Patient does have some EKG abnormalities, most notably S1Q3T3, however, CT PE study without evidence of PE. Patient will need to follow-up with cardiology for further assessment. He will follow-up with orthopedics on his shoulder injury. Strict return precautions discussed.  Patient voices understanding of these instructions, accepts the plan, and is comfortable with discharge.  Findings and plan of care discussed with Varney Biles, MD.   Vitals:   07/01/19 1559 07/01/19 1945 07/01/19 2230 07/01/19 2300  BP: (!) 169/103 (!) 159/93 (!) 162/94 (!) 163/97  Pulse: 88 (!) 58 69 60  Resp: 18 16 14 16   Temp: 99.3 F (37.4 C)     TempSrc: Oral     SpO2: 100% 99% 99% 97%  Weight:      Height:         Final Clinical Impressions(s) / ED Diagnoses   Final diagnoses:  Syncope and collapse  Injury of right shoulder, initial encounter    ED Discharge Orders    None       Layla Maw 07/02/19 [place], Ankit,  MD 07/02/19 [phone]

## 2019-07-01 NOTE — Discharge Instructions (Addendum)
Findings today were overall reassuring, however, close follow-up for this issue is essential. Follow-up with cardiology as soon as possible in this manner.  Call the number provided to set up an appointment. There were some abnormalities on the EKG.  This will need to be reassessed.  It is also likely you will require a mobile heart monitor and an ultrasound of the heart. If the symptoms recur, proceed directly to [facility] emergency department.  Shoulder Injury You have been seen today for a shoulder injury. There were no acute abnormalities on the x-rays, including no sign of fracture or dislocation, however, there could be injuries to the soft tissues, such as the ligaments or tendons that are not seen on xrays. There could also be what are called occult fractures that are small fractures not seen on xray. Antiinflammatory medications: Take 600 mg of ibuprofen every 6 hours or 440 mg (over the counter dose) to 500 mg (prescription dose) of naproxen every 12 hours for the next 3 days. After this time, these medications may be used as needed for pain. Take these medications with food to avoid upset stomach. Choose only one of these medications, do not take them together. Acetaminophen (generic for Tylenol): Should you continue to have additional pain while taking the ibuprofen or naproxen, you may add in acetaminophen as needed. Your daily total maximum amount of acetaminophen from all sources should be limited to 4000mg /day for persons without liver problems, or 2000mg /day for those with liver problems. Ice: May apply ice to the area over the next 24 hours for 15 minutes at a time to reduce swelling. Elevation: Keep the extremity elevated as often as possible to reduce pain and inflammation. Support: Wear the sling for support and comfort.  While wearing the sling, be sure to take the arm out of the sling throughout the day and perform range of motion exercises on the shoulder.  This is to prevent  stiffness starting in the shoulder. Exercises: Start by performing these exercises a few times a week, increasing the frequency until you are performing them twice daily.  Follow up: If symptoms are improving, you may follow up with your primary care provider for any continued management. If symptoms are not starting to improve within a week, you should follow up with the orthopedic specialist within two weeks. Return: Return to the ED for numbness, weakness, increasing pain, overall worsening symptoms, loss of function, or if symptoms are not improving, you have tried to follow up with the orthopedic specialist, and have been unable to do so.  For prescription assistance, may try using prescription discount sites or apps, such as goodrx.com  COVID-19 testing You have a test pending for COVID-19.  Results typically return within about 48 hours.  Be sure to check MyChart for updated results.  We recommend isolating yourself until results are received.  Patients who have symptoms consistent with COVID-19 should self isolated for: At least 3 days (72 hours) have passed since recovery, defined as resolution of fever without the use of fever reducing medications and improvement in respiratory symptoms (e.g., cough, shortness of breath), and At least 7 days have passed since symptoms first appeared.  If you have no symptoms, but your test returns positive, recommend isolating for at least 10 days.

## 2019-07-01 NOTE — ED Notes (Signed)
Patient transported to CT 

## 2019-07-03 LAB — NOVEL CORONAVIRUS, NAA (HOSP ORDER, SEND-OUT TO REF LAB; TAT 18-24 HRS): SARS-CoV-2, NAA: NOT DETECTED

## 2019-07-06 ENCOUNTER — Encounter: Payer: Self-pay | Admitting: Cardiology

## 2019-07-06 ENCOUNTER — Encounter: Payer: Self-pay | Admitting: *Deleted

## 2019-07-06 ENCOUNTER — Other Ambulatory Visit: Payer: Self-pay

## 2019-07-06 ENCOUNTER — Telehealth: Payer: Self-pay

## 2019-07-06 ENCOUNTER — Ambulatory Visit (INDEPENDENT_AMBULATORY_CARE_PROVIDER_SITE_OTHER): Payer: Self-pay | Admitting: Cardiology

## 2019-07-06 VITALS — BP 157/99 | HR 94 | Temp 98.2°F | Ht 68.0 in | Wt 227.0 lb

## 2019-07-06 DIAGNOSIS — R0600 Dyspnea, unspecified: Secondary | ICD-10-CM

## 2019-07-06 DIAGNOSIS — Z7689 Persons encountering health services in other specified circumstances: Secondary | ICD-10-CM

## 2019-07-06 DIAGNOSIS — I10 Essential (primary) hypertension: Secondary | ICD-10-CM

## 2019-07-06 DIAGNOSIS — R079 Chest pain, unspecified: Secondary | ICD-10-CM

## 2019-07-06 DIAGNOSIS — R55 Syncope and collapse: Secondary | ICD-10-CM

## 2019-07-06 DIAGNOSIS — R0609 Other forms of dyspnea: Secondary | ICD-10-CM

## 2019-07-06 MED ORDER — AMLODIPINE BESYLATE 10 MG PO TABS: 10.0000 mg | ORAL_TABLET | Freq: Every day | ORAL | 3 refills | Status: AC

## 2019-07-06 NOTE — Patient Instructions (Signed)
Medication Instructions:  Start: Amlodipine 10 mg daily  *If you need a refill on your cardiac medications before your next appointment, please call your pharmacy*  Lab Work: None  Testing/Procedures: Your physician has requested that you have an echocardiogram. Echocardiography is a painless test that uses sound waves to create images of your heart. It provides your doctor with information about the size and shape of your heart and how well your heart's chambers and valves are working. This procedure takes approximately one hour. There are no restrictions for this procedure. Blake Wolfe has requested that you have a lexiscan myoview. For further information please visit [email or web address]. Please follow instruction sheet, as given.    Follow-Up: At [facility], you and your health needs are our priority.  As part of our continuing mission to provide you with exceptional heart care, we have created designated Provider Care Teams.  These Care Teams include your primary Cardiologist (physician) and Advanced Practice Providers (APPs -  Physician Assistants and Nurse Practitioners) who all work together to provide you with the care you need, when you need it.  Your next appointment:   4 weeks  The format for your next appointment:   In Person  Provider:   Buford Dresser, MD   You are scheduled for a Myocardial Perfusion Imaging Study on.  Please arrive 15 minutes prior to your appointment time for registration and insurance purposes.  The test will take approximately 3 to 4 hours to complete; you may bring reading material.  If someone comes with you to your appointment, they will need to remain in the main lobby due to limited space in the testing area. **If you are pregnant or breastfeeding, please notify the nuclear lab prior to your appointment**  How to prepare for your Myocardial Perfusion Test: . Do not eat or drink 3 hours prior to  your test, except you may have water. . Do not consume products containing caffeine (regular or decaffeinated) 12 hours prior to your test. (ex: coffee, chocolate, sodas, tea). . Do bring a list of your current medications with you.  If not listed below, you may take your medications as normal. . Do wear comfortable clothes (no dresses or overalls) and walking shoes, tennis shoes preferred (No heels or open toe shoes are allowed). . Do NOT wear cologne, perfume, aftershave, or lotions (deodorant is allowed). . If these instructions are not followed, your test will have to be rescheduled.  Please report to [address], [address] for your test.  If you have questions or concerns about your appointment, you can call the Nuclear Lab at [phone].  If you cannot keep your appointment, please provide 24 hours notification to the Nuclear Lab, to avoid a possible $50 charge to your account.

## 2019-07-06 NOTE — Telephone Encounter (Signed)
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  Will route to MD

## 2019-07-06 NOTE — Telephone Encounter (Signed)
He should remain not driving until results of echo and stress test return. If those are normal, he is clear from a cardiac perspective. Thanks.

## 2019-07-06 NOTE — Progress Notes (Signed)
Cardiology Office Note:    Date:  07/06/2019   ID:  Blake Wolfe, DOB 01/26/1961, MRN [id number]  PCP:  Patient, No Pcp Per  Cardiologist:  Buford Dresser, MD  Referring MD: Arlean Hopping, PA  CC: new patient consultation for syncope  History of Present Illness:    Blake Wolfe is a 58 y.o. male with a hx of syncope who is seen as a new consult at the request of Arlean Hopping, [facility]) for the evaluation and management of syncope. On interview, he also endorsed dyspnea on exertion and chest tightness  Syncope:  This most recent episode occurred several days ago in the afternoon. He stood up from a sitting position, is unsure if he made it a few steps, but them apparently lost consciousness. He denies any prodrome. He immediately regained consciousness without confusion. Granddaughter was present and witnessed, wife was upstairs but came immediately when she heard him fall.   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 Potential medication/supplement interactions: Prior workup: when this happened ~2012, had cath, echo, unremarkable  Also endorses intermittent shortness of breath with exertion with lower chest tightness. Feels like a knot in the bottom of his rib cage. Gets worse when he presses on his chest. Notes it feels mostly like being out of breath. Does not radiate, no associated symptoms beyond shortness of breath. No aggravating or alleviating factors. Mild.  Discharge summary from  2006:   HOSPITAL COURSE:  Problem 1:  Recurrent episodes of syncope/presyncope.  [name] was  admitted to the hospital to be evaluated for his syncope/presyncope in the  setting of exertional chest pain.  It was felt this may be neurogenic  mediated versus cardiogenic.  Cardiac enzymes were obtained which were  negative to rule out MI.  A 2D echo was obtained with the results as stated  above to look for any structural abnormalities in his heart.  Cardiology was  also consulted during the hospitalization to help delineate a cardiogenic  source, as cardiac arrhythmia or structural heart disease could have been  part of the results of syncope/presyncope.  Seizure was in the differential  and was less likely as the patient denied any postictal confusion or tongue  biting or no incontinence.  Head CT was also negative.  Cardiology was  consulted, Dr. [name] was initially consulted, a 2D echo was done and  showed EF between 52-55% with mildly increased left ventricular wall  thickness.  The patient was taken to the cardiac catheterization lab and  cardiac catheterization was done with results stated above which was  negative for any coronary artery disease.  [name] was also  contacted, who is an electrophysiologist with [facility], as well.  Please see consult note.  It was felt that the patient's symptoms were most  likely neurally mediated.  The patient was asymptomatic during  hospitalization, did not have any episodes of syncope or presyncope during  the hospitalization.  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  [name] was consulted on his driving  privileges and  felt that the patient's driving privileges were not to be  taken away as the patient did not experience syncope with sitting or lying  down.  The patient improved throughout the hospitalization and remained in  stable condition, did not experience anymore  episodes of syncope or  presyncope, nor any chest tightness.  The patient was improved and stable on  discharge and was discharged in stable condition.   Problem 2:  Hypertension.  This is likely thought to be secondary to stress  induced.  The patient's blood pressure in the hospital remained in the range  of 114 through 130s, felt to be stress induced.  The patient's blood  pressure was monitored and normalized during the hospitalization.  The  patient was asymptomatic during this time.   Problem 3:  Dyslipidemia.  During the hospitalization, the patient was risk  stratified.  A cholesterol panel was obtained and it was found that the patient had an elevated triglyceride level of 437 with LDL of 93.  The  patient was started on Tricor 54 mg daily and advised on some dietary  modifications for his dyslipidemia.  The patient was asymptomatic during the  hospitalization and stable.   Problem 4:  Hyperglycemia felt to be stress induced.  Hemoglobin A1C was  obtained which was 5.9.  The patient did have one episode of glucose levels  being in the 140s, but for the rest of the hospitalization, the patient's  blood glucose levels ranged from the 90s to the low 100s.  It was felt that  the patient would be monitored in terms of his blood glucose levels as an  outpatient for this.   Problem 5:  Chest tightness with no coronary artery disease and normal left  ventricular function.  The patient had a cardiac catheterization and had no  coronary artery disease.  2D echo was done and did not show any structural  heart disease.  The patient did not have any significant family history and  no evidence on EKG.  The patient's chest tightness improved and the patient  had no episodes of chest tightness during his hospitalization.  It was felt  that the patient's chest tightness might just be pleuritic chest pain.  The  patient did not have any episodes of chest tightness during the   hospitalization.  The patient was stable in this aspect.  The patient was  discharged home in stable and improved condition.   Past Surgical History:  Procedure Laterality Date  . APPENDECTOMY    . CHOLECYSTECTOMY      Current Medications: No current outpatient medications on file prior to visit.   No current facility-administered medications on file prior to visit.      Allergies:   Patient has no known allergies.   Social History   Tobacco Use  . Smoking status: Never Smoker  . Smokeless tobacco: Never Used  Substance Use Topics  . Alcohol use: No  . Drug use: No    Family History: family history includes Diabetes in his mother; Hypertension in his mother; Kidney failure in his mother; Prostate cancer (age of onset: 48) in his father.  ROS:   Please see the history of present illness.  Additional pertinent ROS: Constitutional: Negative for chills, fever, night sweats, unintentional weight loss  HENT: Negative for ear pain and hearing loss.   Eyes: Negative for loss of vision and eye pain.  Respiratory: Negative for cough, sputum, wheezing.   Cardiovascular: See HPI. Gastrointestinal: Negative for abdominal  pain, melena, and hematochezia.  Genitourinary: Negative for dysuria and hematuria.  Musculoskeletal: Negative for falls and myalgias.  Skin: Negative for itching and rash.  Neurological: Negative for focal weakness, focal sensory changes and loss of consciousness.  Endo/Heme/Allergies: Does not bruise/bleed easily.     EKGs/Labs/Other Studies Reviewed:    The following studies were reviewed today: Cath 02/10/2005 HEMODYNAMIC DATA:  1.  Central aortic pressure 118/81.  2.  Left ventricular pressure 129/11.  3.  No gradient on pullback across aortic valve.   ANGIOGRAPHIC DATA:  1.  Ventriculography was performed in the RAO projection. Overall systolic      function was preserved. No definite wall motion abnormalities were seen.      Ejection fraction was  calculated in the RAO projection at 62%.  2.  The left main is free of critical disease.  3.  The left anterior descending artery courses to the apex. There two      smaller diagonal branches. Other than minimal luminal irregularity, the      LAD is free of critical disease.  4.  The circumflex provides a proximal marginal branch and then divides into      an AV circumflex which provides a posterolateral system and a fairly      large second obtuse marginal branch. The circumflex system appears to be      relatively smooth throughout. No obvious evidence of obstruction is      identified.  5.  The right coronary artery is a relatively small vessel providing a      single PDA. There was some ostial spasm noted which appeared to be      relieved by intracoronary nitroglycerin. The right coronary artery      itself appeared to be smooth throughout. Because of the proximal spasm,      there is perhaps 20 to 30% narrowing at the ostium, but this did not      appear to be high grade or flow limiting.   CONCLUSIONS:  1.  Preserved left ventricular function.  2.  No evidence of high-grade obstructive disease.  3.  Mild ostial spasm of the right coronary during injection.   EKG:  EKG is personally reviewed.  The ekg ordered today demonstrates NSR  Recent Labs: 07/01/2019: ALT 33; BUN 15; Creatinine, Ser 1.06; Hemoglobin 14.1; Platelets 268; Potassium 3.7; Sodium 136  Recent Lipid Panel No results found for: CHOL, TRIG, HDL, CHOLHDL, VLDL, LDLCALC, LDLDIRECT  Physical Exam:    VS:  BP (!) 157/99   Pulse 94   Temp 98.2 F (36.8 C)   Ht 5\' 8"  (1.727 m)   Wt 227 lb (103 kg)   SpO2 97%   BMI 34.52 kg/m     Wt Readings from Last 3 Encounters:  07/06/19 227 lb (103 kg)  07/01/19 215 lb (97.5 kg)  04/17/14 200 lb (90.7 kg)    GEN: Well nourished, well developed in no acute distress HEENT: Normal, moist mucous membranes NECK: No JVD CARDIAC: regular rhythm, normal S1 and S2, no rubs  or gallops. No murmurs. VASCULAR: Radial and DP pulses 2+ bilaterally. No carotid bruits RESPIRATORY:  Clear to auscultation without rales, wheezing or rhonchi  ABDOMEN: Soft, non-tender, non-distended MUSCULOSKELETAL:  Ambulates independently SKIN: Warm and dry, no edema NEUROLOGIC:  Alert and oriented x 3. No focal neuro deficits noted. PSYCHIATRIC:  Normal affect    ASSESSMENT:    1. Syncope, unspecified syncope type   2. Chest pain, unspecified type  3. Encounter to establish care   4. DOE (dyspnea on exertion)   5. Essential hypertension    PLAN:    Syncope, dyspnea on exertion, chest tightness: similar to episode he had in 2006. Workup at that time, including cath and echo, was unremarkable. This was positional, but there was no prodrome -ECG unremarkable -will get echo, lesixcan stress to rule out structural and ischemic etiologies -if workup is unrevealing, then he is clear from a cardiac standpoint as this is similar to prior, likely neural/reflex mediated with position changes. This type of syncope would be low risk for driving -however, if symptoms recur, he will need to discuss with PCP when established if neuro workup required. -we will work to expedite his testing, but he should not drive until echo and stress test complete and reviewed.  Hypertension: -with history of prior spasm, symptoms of above, amlodipine is good choice for him. Will start max dose given reported persistently elevated BP numbers -he needs to establish with PCP long term for management. Referred today.  Plan for follow up: 4 weeks to discuss results and monitor response to medication  Medication Adjustments/Labs and Tests Ordered: Current medicines are reviewed at length with the patient today.  Concerns regarding medicines are outlined above.  Orders Placed This Encounter  Procedures  . Ambulatory referral to Internal Medicine  . MYOCARDIAL PERFUSION IMAGING  . EKG 12-Lead  . ECHOCARDIOGRAM  COMPLETE   Meds ordered this encounter  Medications  . amLODipine (NORVASC) 10 MG tablet    Sig: Take 1 tablet (10 mg total) by mouth daily.    Dispense:  180 tablet    Refill:  3    Patient Instructions  Medication Instructions:  Start: Amlodipine 10 mg daily  *If you need a refill on your cardiac medications before your next appointment, please call your pharmacy*  Lab Work: None  Testing/Procedures: Your physician has requested that you have an echocardiogram. Echocardiography is a painless test that uses sound waves to create images of your heart. It provides your doctor with information about the size and shape of your heart and how well your heart's chambers and valves are working. This procedure takes approximately one hour. There are no restrictions for this procedure. [place] has requested that you have a lexiscan myoview. For further information please visit [email or web address]. Please follow instruction sheet, as given.    Follow-Up: At [facility], you and your health needs are our priority.  As part of our continuing mission to provide you with exceptional heart care, we have created designated Provider Care Teams.  These Care Teams include your primary Cardiologist (physician) and Advanced Practice Providers (APPs -  Physician Assistants and Nurse Practitioners) who all work together to provide you with the care you need, when you need it.  Your next appointment:   4 weeks  The format for your next appointment:   In Person  Provider:   Buford Dresser, MD   You are scheduled for a Myocardial Perfusion Imaging Study on.  Please arrive 15 minutes prior to your appointment time for registration and insurance purposes.  The test will take approximately 3 to 4 hours to complete; you may bring reading material.  If someone comes with you to your appointment, they will need to remain in the main lobby due to limited  space in the testing area. **If you are pregnant or breastfeeding, please notify the nuclear lab prior to your appointment**  How  to prepare for your Myocardial Perfusion Test: . Do not eat or drink 3 hours prior to your test, except you may have water. . Do not consume products containing caffeine (regular or decaffeinated) 12 hours prior to your test. (ex: coffee, chocolate, sodas, tea). . Do bring a list of your current medications with you.  If not listed below, you may take your medications as normal. . Do wear comfortable clothes (no dresses or overalls) and walking shoes, tennis shoes preferred (No heels or open toe shoes are allowed). . Do NOT wear cologne, perfume, aftershave, or lotions (deodorant is allowed). . If these instructions are not followed, your test will have to be rescheduled.  Please report to [address] for your test.  If you have questions or concerns about your appointment, you can call the Nuclear Lab at [phone].  If you cannot keep your appointment, please provide 24 hours notification to the Nuclear Lab, to avoid a possible $50 charge to your account.      Signed, Buford Dresser, MD PhD 07/06/2019 5:37 PM    [place]

## 2019-07-09 ENCOUNTER — Other Ambulatory Visit: Payer: Self-pay

## 2019-07-09 ENCOUNTER — Telehealth (HOSPITAL_COMMUNITY): Payer: Self-pay

## 2019-07-09 ENCOUNTER — Ambulatory Visit (HOSPITAL_COMMUNITY): Payer: Self-pay | Attending: Internal Medicine

## 2019-07-09 DIAGNOSIS — R55 Syncope and collapse: Secondary | ICD-10-CM

## 2019-07-09 NOTE — Telephone Encounter (Signed)
Instructions for the patient's stress test was left on his answering machine. Asked to call back with any questions. S.Eoghan Belcher EMTP

## 2019-07-09 NOTE — Telephone Encounter (Signed)
Attempted to contact pt. Unable to leave message as mailbox is full.  °

## 2019-07-10 ENCOUNTER — Ambulatory Visit (HOSPITAL_COMMUNITY): Payer: Self-pay | Attending: Cardiovascular Disease

## 2019-07-10 VITALS — Ht 68.0 in | Wt 227.0 lb

## 2019-07-10 DIAGNOSIS — R0609 Other forms of dyspnea: Secondary | ICD-10-CM

## 2019-07-10 DIAGNOSIS — R079 Chest pain, unspecified: Secondary | ICD-10-CM | POA: Insufficient documentation

## 2019-07-10 DIAGNOSIS — R0600 Dyspnea, unspecified: Secondary | ICD-10-CM | POA: Insufficient documentation

## 2019-07-10 LAB — MYOCARDIAL PERFUSION IMAGING
LV dias vol: 90 mL (ref 62–150)
LV sys vol: 43 mL
Peak HR: 86 {beats}/min
Rest HR: 77 {beats}/min
SDS: 0
SRS: 0
SSS: 0
TID: 0.96

## 2019-07-10 MED ORDER — TECHNETIUM TC 99M TETROFOSMIN IV KIT
10.4000 | PACK | Freq: Once | INTRAVENOUS | Status: AC | PRN
  Administered 2019-07-10: 10.4 via INTRAVENOUS
  Filled 2019-07-10: qty 11

## 2019-07-10 MED ORDER — TECHNETIUM TC 99M TETROFOSMIN IV KIT
32.9000 | PACK | Freq: Once | INTRAVENOUS | Status: AC | PRN
  Administered 2019-07-10: 32.9 via INTRAVENOUS
  Filled 2019-07-10: qty 33

## 2019-07-10 MED ORDER — REGADENOSON 0.4 MG/5ML IV SOLN
0.4000 mg | Freq: Once | INTRAVENOUS | Status: AC
  Administered 2019-07-10: 0.4 mg via INTRAVENOUS

## 2019-07-10 NOTE — Telephone Encounter (Signed)
Informed pt letter will be available for pick up at the front desk.

## 2019-07-12 ENCOUNTER — Encounter: Payer: Self-pay | Admitting: Cardiology

## 2019-08-07 ENCOUNTER — Ambulatory Visit: Payer: Self-pay | Admitting: Cardiology

## 2019-10-03 ENCOUNTER — Emergency Department (HOSPITAL_COMMUNITY)
Admission: EM | Admit: 2019-10-03 | Discharge: 2019-10-03 | Disposition: A | Discharge status: Home / Self Care | Payer: Self-pay | Attending: Emergency Medicine | Admitting: Emergency Medicine

## 2019-10-03 ENCOUNTER — Emergency Department (HOSPITAL_COMMUNITY): Payer: Self-pay

## 2019-10-03 ENCOUNTER — Other Ambulatory Visit: Payer: Self-pay

## 2019-10-03 DIAGNOSIS — Y939 Activity, unspecified: Secondary | ICD-10-CM | POA: Insufficient documentation

## 2019-10-03 DIAGNOSIS — I10 Essential (primary) hypertension: Secondary | ICD-10-CM | POA: Insufficient documentation

## 2019-10-03 DIAGNOSIS — Y999 Unspecified external cause status: Secondary | ICD-10-CM | POA: Insufficient documentation

## 2019-10-03 DIAGNOSIS — W109XXA Fall (on) (from) unspecified stairs and steps, initial encounter: Secondary | ICD-10-CM | POA: Insufficient documentation

## 2019-10-03 DIAGNOSIS — S20211A Contusion of right front wall of thorax, initial encounter: Secondary | ICD-10-CM | POA: Insufficient documentation

## 2019-10-03 DIAGNOSIS — Y929 Unspecified place or not applicable: Secondary | ICD-10-CM | POA: Insufficient documentation

## 2019-10-03 DIAGNOSIS — Z79899 Other long term (current) drug therapy: Secondary | ICD-10-CM | POA: Insufficient documentation

## 2019-10-03 MED ORDER — TRAMADOL HCL 50 MG PO TABS: 50.0000 mg | ORAL_TABLET | Freq: Four times a day (QID) | ORAL | 0 refills | Status: DC | PRN

## 2019-10-03 NOTE — ED Notes (Signed)
Pt verbalizes understanding of DC instructions. Pt belongings returned and is ambulatory out of ED.    Signature pad not availible

## 2019-10-03 NOTE — ED Notes (Signed)
Pt verbalizes understanding of DC instructions. Pt belongings returned and is ambulatory out of ED.  

## 2019-10-03 NOTE — ED Triage Notes (Signed)
Pt arrived POV ambulatory into the ED CC Left anterior/posterior rip pain on deep inspiration r/t Fall last Friday down steps. Pt denies head neck or LOC during fall.   VSS

## 2019-10-03 NOTE — Discharge Instructions (Addendum)
Take ibuprofen 600 mg every 6 hours as needed for pain.  Tramadol as prescribed as needed for pain not relieved with ibuprofen.  Return to the emergency department if your symptoms significantly worsen or change.

## 2019-10-03 NOTE — ED Provider Notes (Signed)
[facility] DEPT Provider Note   CSN: [id number] Arrival date & time: 10/03/19  1437     History Chief Complaint  Patient presents with  . Chest Pain    Fall    Blake Wolfe Fallen is a 59 y.o. male.  Patient is a 59 year old male with history of hypertension.  He presents today for evaluation of a fall.  Patient states he slipped down several steps 5 days ago and has been having severe pain in his right ribs since.  It is worse when he breathes or sneezes.  He describes pain with taking a full breath.  He denies any fevers, chills, or productive cough.  He denies any hemoptysis.  He denies other injury in the fall.  The history is provided by the patient.  Chest Pain Pain location:  R chest Pain quality: sharp   Pain radiates to:  Does not radiate Pain severity:  Moderate Onset quality:  Sudden Duration:  5 days Timing:  Constant Progression:  Unchanged Chronicity:  New Context: breathing and movement   Relieved by:  Nothing Worsened by:  Coughing and deep breathing      No past medical history on file.  Patient Active Problem List   Diagnosis Date Noted  . DYSLIPIDEMIA 03/19/2010  . HYPERTENSION 03/19/2010    Past Surgical History:  Procedure Laterality Date  . APPENDECTOMY    . CHOLECYSTECTOMY         Family History  Problem Relation Age of Onset  . Hypertension Mother   . Diabetes Mother   . Kidney failure Mother   . Prostate cancer Father 43    Social History   Tobacco Use  . Smoking status: Never Smoker  . Smokeless tobacco: Never Used  Substance Use Topics  . Alcohol use: No  . Drug use: No    Home Medications Prior to Admission medications   Medication Sig Start Date End Date Taking? Authorizing Provider  amLODipine (NORVASC) 10 MG tablet Take 1 tablet (10 mg total) by mouth daily. 07/06/19 10/04/19  Buford Dresser, MD    Allergies    Patient has no known allergies.  Review of Systems   Review of  Systems  Cardiovascular: Positive for chest pain.  All other systems reviewed and are negative.   Physical Exam Updated Vital Signs BP (!) 158/95   Pulse 87   Temp 98.5 F (36.9 C) (Oral)   Resp 18   SpO2 96%   Physical Exam Vitals and nursing note reviewed.  Constitutional:      General: He is not in acute distress.    Appearance: He is well-developed. He is not diaphoretic.  HENT:     Head: Normocephalic and atraumatic.  Cardiovascular:     Rate and Rhythm: Normal rate and regular rhythm.     Heart sounds: No murmur. No friction rub.  Pulmonary:     Effort: Pulmonary effort is normal. No respiratory distress.     Breath sounds: Normal breath sounds. No wheezing or rales.  Chest:     Chest wall: Tenderness present.     Comments: There is tenderness to palpation to the right lateral chest.  There is no palpable abnormality or crepitus.  Breath sounds are equal bilaterally. Abdominal:     General: Bowel sounds are normal. There is no distension.     Palpations: Abdomen is soft.     Tenderness: There is no abdominal tenderness.  Musculoskeletal:        General: Normal  range of motion.     Cervical back: Normal range of motion and neck supple.  Skin:    General: Skin is warm and dry.  Neurological:     Mental Status: He is alert and oriented to person, place, and time.     Coordination: Coordination normal.     ED Results / Procedures / Treatments   Labs (all labs ordered are listed, but only abnormal results are displayed) Labs Reviewed - No data to display  EKG None  Radiology DG Chest 2 View  Result Date: 10/03/2019 CLINICAL DATA:  Right shoulder and rib pain on deep inspiration. Fall last Friday. EXAM: CHEST - 2 VIEW COMPARISON:  07/01/2019 FINDINGS: Midline trachea. Normal heart size and mediastinal contours. No pleural effusion or pneumothorax. Mild scarring or subsegmental atelectasis at the left lung base laterally. Cholecystectomy IMPRESSION: No acute  cardiopulmonary disease. Electronically Signed   By: Abigail Miyamoto M.D.   On: 10/03/2019 15:29    Procedures Procedures (including critical care time)  Medications Ordered in ED Medications - No data to display  ED Course  I have reviewed the triage vital signs and the nursing notes.  Pertinent labs & imaging results that were available during my care of the patient were reviewed by me and considered in my medical decision making (see chart for details).    MDM Rules/Calculators/A&P  Patient's x-rays negative for fracture or pneumothorax.  Oxygen saturations are adequate and he is in no distress.  The injury occurred 5 days ago and I believe patient to be appropriate for discharge.  He will be given ibuprofen and tramadol.  To return as needed if not improving.  Final Clinical Impression(s) / ED Diagnoses Final diagnoses:  None    Rx / DC Orders ED Discharge Orders    None       Veryl Speak, MD 10/03/19 1640

## 2021-12-07 ENCOUNTER — Other Ambulatory Visit: Payer: Self-pay | Admitting: Radiation Oncology

## 2021-12-07 ENCOUNTER — Ambulatory Visit
Admission: RE | Admit: 2021-12-07 | Discharge: 2021-12-07 | Disposition: A | Discharge status: Home / Self Care | Payer: Self-pay | Source: Ambulatory Visit | Attending: Radiation Oncology | Admitting: Radiation Oncology

## 2021-12-07 DIAGNOSIS — C61 Malignant neoplasm of prostate: Secondary | ICD-10-CM

## 2021-12-15 NOTE — Progress Notes (Signed)
GU Location of Tumor / Histology: Prostate Ca ? ?If Prostate Cancer, Gleason Score is (5 + 4) and PSA is (42.5 as of 09/2021) ? ?Biopsies: ?11/10/2021 ?Dr. Domenica Fail ? ? ? ?Past/Anticipated interventions by urology, if any: NA ? ?Past/Anticipated interventions by medical oncology, if any: NA ? ?Weight changes, if any:  No ? ?IPSS:  12 ?SHIM:  17 ? ?Bowel/Bladder complaints, if any:  Some diarrhea and urinary frequency. ? ?Nausea/Vomiting, if any: Yes, nausea no vomiting. ? ?Pain issues, if any:  8/10 ? ?SAFETY ISSUES: ?Prior radiation?  No ?Pacemaker/ICD? No ?Possible current pregnancy? Male ?Is the patient on methotrexate? No ? ?Current Complaints / other details:  PSMA PET scan hasn't been scheduled yet per patient. ?

## 2021-12-18 ENCOUNTER — Ambulatory Visit
Admission: RE | Admit: 2021-12-18 | Discharge: 2021-12-18 | Disposition: A | Discharge status: Home / Self Care | Payer: Self-pay | Source: Ambulatory Visit | Attending: Radiation Oncology | Admitting: Radiation Oncology

## 2021-12-18 ENCOUNTER — Ambulatory Visit
Admission: RE | Admit: 2021-12-18 | Discharge: 2021-12-18 | Disposition: A | Discharge status: Home / Self Care | Payer: Medicaid Other | Source: Ambulatory Visit | Attending: Radiation Oncology | Admitting: Radiation Oncology

## 2021-12-18 ENCOUNTER — Other Ambulatory Visit: Payer: Self-pay

## 2021-12-18 VITALS — BP 134/80 | HR 85 | Temp 97.7°F | Resp 20 | Ht 68.0 in | Wt 220.2 lb

## 2021-12-18 DIAGNOSIS — F419 Anxiety disorder, unspecified: Secondary | ICD-10-CM | POA: Insufficient documentation

## 2021-12-18 DIAGNOSIS — C61 Malignant neoplasm of prostate: Secondary | ICD-10-CM

## 2021-12-18 DIAGNOSIS — G4730 Sleep apnea, unspecified: Secondary | ICD-10-CM | POA: Insufficient documentation

## 2021-12-18 DIAGNOSIS — Z8042 Family history of malignant neoplasm of prostate: Secondary | ICD-10-CM | POA: Insufficient documentation

## 2021-12-18 DIAGNOSIS — R972 Elevated prostate specific antigen [PSA]: Secondary | ICD-10-CM | POA: Insufficient documentation

## 2021-12-18 DIAGNOSIS — H5210 Myopia, unspecified eye: Secondary | ICD-10-CM | POA: Insufficient documentation

## 2021-12-18 DIAGNOSIS — Z923 Personal history of irradiation: Secondary | ICD-10-CM | POA: Insufficient documentation

## 2021-12-18 DIAGNOSIS — Z79899 Other long term (current) drug therapy: Secondary | ICD-10-CM | POA: Insufficient documentation

## 2021-12-18 NOTE — Progress Notes (Signed)
?Radiation Oncology         [phone] ?________________________________ ? ?Initial Outpatient Consultation ? ?Name: Blake Wolfe MRN: [id number]  ?Date: 12/18/2021  DOB: [birth year] ? ?[id number], No Pcp Per (Inactive)  [facility]  ? ?REFERRING PHYSICIAN: [facility] ? ?DIAGNOSIS: 61 y.o. gentleman with Stage T1c adenocarcinoma of the prostate with Gleason score of 4+5, and PSA of 34.7. ? ?  ICD-10-CM   ?1. Malignant neoplasm of prostate ([facility] [place])  C61   ?  ? ? ?HISTORY OF PRESENT ILLNESS: Blake Wolfe is a 61 y.o. male with a diagnosis of prostate cancer. He was noted to have an elevated PSA of 42.5 in 09/2021, by his primary care physician with the [facility]. Accordingly, he was referred for evaluation in urology by Dr. Carlota Raspberry on 10/27/21. He underwent CT A/P and bone scan on 10/30/21, both showing no acute abnormality or suspicious activity. ? ?The patient proceeded to transrectal ultrasound with 12 biopsies of the prostate on 11/10/21 under the care of Dr. Domenica Fail, digital rectal examination was performed at that time revealing no nodules. The prostate volume measured 34 cc.  Out of 12 core biopsies, 10 were positive.  The maximum Gleason score was 4+5, and this was seen in all 6/6 right-sided cores. Additionally, Gleason 4+3 was seen in 4 of 6 left-sided cores. ? ?He was referred to Dr. Merrie Roof, in medical oncology at the [facility] on 11/26/21 and met with Dr. Ali Lowe in radiation oncology at the [facility] on 12/04/21. A repeat PSA on 11/26/21 had decreased slightly but remained significantly elevated at 34.7. He was started on ADT with [employer] on 12/15/2021.  He was also started on abiraterone at that time.  A PSMA PET scan was ordered and scheduled at [facility] but has not yet been completed because his insurance denied coverage for the study to be performed in the [facility] health system. ? ?The patient reviewed the biopsy results with his urologist and he has kindly been referred today for discussion of  potential radiation treatment options closer to home here in [place].. ? ? ?PREVIOUS RADIATION THERAPY: No ? ?PAST MEDICAL HISTORY: No past medical history on file.   ? ?PAST SURGICAL HISTORY: ?Past Surgical History:  ?Procedure Laterality Date  ? APPENDECTOMY    ? CHOLECYSTECTOMY    ? ? ?FAMILY HISTORY:  ?Family History  ?Problem Relation Age of Onset  ? Hypertension Mother   ? Diabetes Mother   ? Kidney failure Mother   ? Prostate cancer Father 15  ? ? ?SOCIAL HISTORY:  ?Social History  ? ?Socioeconomic History  ? Marital status: Married  ?  Spouse name: Not on file  ? Number of children: Not on file  ? Years of education: Not on file  ? Highest education level: Not on file  ?Occupational History  ? Not on file  ?Tobacco Use  ? Smoking status: Never  ? Smokeless tobacco: Never  ?Substance and Sexual Activity  ? Alcohol use: No  ? Drug use: No  ? Sexual activity: Not on file  ?Other Topics Concern  ? Not on file  ?Social History Narrative  ? Not on file  ? ?Social Determinants of Health  ? ?Financial Resource Strain: Not on file  ?Food Insecurity: Not on file  ?Transportation Needs: Not on file  ?Physical Activity: Not on file  ?Stress: Not on file  ?Social Connections: Not on file  ?Intimate Partner Violence: Not on file  ? ? ?ALLERGIES: Patient has no known allergies. ? ?MEDICATIONS:  ?  Current Outpatient Medications  ?Medication Sig Dispense Refill  ? abiraterone acetate (ZYTIGA) 250 MG tablet TAKE FOUR TABLETS BY MOUTH DAILY FOR PROSTATE    ? Calcium Carb-Cholecalciferol 600-10 MG-MCG TABS Take 1 tablet by mouth daily.    ? methocarbamol (ROBAXIN) 500 MG tablet TAKE ONE TABLET BY MOUTH THREE TIMES A DAY FOR BACK PAIN    ? omeprazole (PRILOSEC) 20 MG capsule TAKE ONE CAPSULE BY MOUTH DAILY FOR ACID REFLUX (TAKE ON AN EMPTY STOMACH 30 MINUTES PRIOR TO A MEAL)    ? ondansetron (ZOFRAN) 8 MG tablet TAKE ONE TABLET BY MOUTH TWICE A DAY FOR NAUSEA    ? predniSONE (DELTASONE) 5 MG tablet Take 1 tablet by mouth  daily.    ? amLODipine (NORVASC) 10 MG tablet Take 1 tablet (10 mg total) by mouth daily. 180 tablet 3  ? traMADol (ULTRAM) 50 MG tablet Take 1 tablet (50 mg total) by mouth every 6 (six) hours as needed. 15 tablet 0  ? ?No current facility-administered medications for this encounter.  ? ? ?REVIEW OF SYSTEMS:  On review of systems, the patient reports that he is doing well overall. He denies any chest pain, shortness of breath, cough, fevers, chills, night sweats, unintended weight changes. He denies abdominal pain, or vomiting. He does report nausea and pain, rated 8/10 today, from his recent injection. His IPSS was 12, indicating moderate urinary symptoms. He reports urinary frequency, as well as some diarrhea. His SHIM was 17, indicating he has moderate erectile dysfunction. A complete review of systems is obtained and is otherwise negative. ? ?  ?PHYSICAL EXAM:  ?Wt Readings from Last 3 Encounters:  ?12/18/21 220 lb 3.2 oz (99.9 kg)  ?07/10/19 227 lb (103 kg)  ?07/06/19 227 lb (103 kg)  ? ?Temp Readings from Last 3 Encounters:  ?12/18/21 97.7 ?F (36.5 ?C)  ?10/03/19 98.5 ?F (36.9 ?C) (Oral)  ?07/06/19 98.2 ?F (36.8 ?C)  ? ?BP Readings from Last 3 Encounters:  ?12/18/21 134/80  ?10/03/19 (!) 158/95  ?07/06/19 (!) 157/99  ? ?Pulse Readings from Last 3 Encounters:  ?12/18/21 85  ?10/03/19 87  ?07/06/19 94  ? ?Pain Assessment ?Pain Score: 8  ?Pain Loc: Abdomen (pain from injection couple days ago)/10 ? ?In general this is a well appearing African-American male in no acute distress. He's alert and oriented x4 and appropriate throughout the examination. Cardiopulmonary assessment is negative for acute distress, and he exhibits normal effort.   ? ? ?KPS = 100 ? ?100 - Normal; no complaints; no evidence of disease. ?90   - Able to carry on normal activity; minor signs or symptoms of disease. ?80   - Normal activity with effort; some signs or symptoms of disease. ?84   - Cares for self; unable to carry on normal activity  or to do active work. ?60   - Requires occasional assistance, but is able to care for most of his personal needs. ?50   - Requires considerable assistance and frequent medical care. ?30   - Disabled; requires special care and assistance. ?30   - Severely disabled; hospital admission is indicated although death not imminent. ?20   - Very sick; hospital admission necessary; active supportive treatment necessary. ?10   - Moribund; fatal processes progressing rapidly. ?0     - Dead ? ?Karnofsky DA, Abelmann WH, Craver LS and Burchenal [facility] [phone]) The use of the nitrogen mustards in the palliative treatment of carcinoma: with particular reference to bronchogenic carcinoma Cancer 1 634-56 ? ?  LABORATORY DATA:  ?Lab Results  ?Component Value Date  ? WBC 6.8 07/01/2019  ? HGB 14.1 07/01/2019  ? HCT 44.6 07/01/2019  ? MCV 85.6 07/01/2019  ? PLT 268 07/01/2019  ? ?Lab Results  ?Component Value Date  ? NA 136 07/01/2019  ? K 3.7 07/01/2019  ? CL 102 07/01/2019  ? CO2 24 07/01/2019  ? ?Lab Results  ?Component Value Date  ? ALT 33 07/01/2019  ? AST 26 07/01/2019  ? ALKPHOS 54 07/01/2019  ? BILITOT 0.1 (L) 07/01/2019  ? ?  ?RADIOGRAPHY: No results found. ?   ?IMPRESSION/PLAN: ?1. 61 y.o. gentleman with Stage T1c adenocarcinoma of the prostate with Gleason Score of 4+5, and PSA of 34.7. ?We discussed the patient's workup and outlined the nature of prostate cancer in this setting. The patient's T stage, Gleason's score, and PSA put him into the high risk group.  We recommend proceeding with a PSMA PET scan to complete his disease staging and will order this study to be completed within [facility] health.  Pending this study does not show any unexpected diffuse metastatic disease, he is eligible for a variety of potential treatment options including prostatectomy or LT-ADT in combination with either 8 weeks of external radiation or 5 weeks of external radiation with an upfront brachytherapy boost. We discussed the available radiation  techniques, and focused on the details and logistics of delivery. We discussed and outlined the risks, benefits, short and long-term effects associated with radiotherapy and compared and contrasted these with prostat

## 2021-12-21 ENCOUNTER — Telehealth: Payer: Self-pay | Admitting: *Deleted

## 2021-12-21 NOTE — Progress Notes (Signed)
RN received request for patient to have surgical consult with Dr. Alinda Money @ [facility].  ? ?Detailed message left with Estill Bamberg, [facility], at the [place] regarding additional referral needed.  Will continue to follow up.  ? ?PET PSMA scheduled for this Friday, 4/28.   ?

## 2021-12-21 NOTE — Telephone Encounter (Signed)
Called patient to inform of PSMA Pet Scan for 12-25-21- arrival time- 1 pm @ [facility], no restrictions to test, spoke with patent and he is aware  of this scan ?

## 2021-12-25 ENCOUNTER — Encounter (HOSPITAL_COMMUNITY)
Admission: RE | Admit: 2021-12-25 | Discharge: 2021-12-25 | Disposition: A | Discharge status: Home / Self Care | Payer: No Typology Code available for payment source | Source: Ambulatory Visit | Attending: Urology | Admitting: Urology

## 2021-12-25 DIAGNOSIS — C61 Malignant neoplasm of prostate: Secondary | ICD-10-CM | POA: Diagnosis not present

## 2021-12-25 MED ORDER — PIFLIFOLASTAT F 18 (PYLARIFY) INJECTION
9.0000 | Freq: Once | INTRAVENOUS | Status: AC
  Administered 2021-12-25: 8.34 via INTRAVENOUS

## 2021-12-25 NOTE — Progress Notes (Signed)
RN left voicemail with Estill Bamberg @ [place] to follow up with additional request for services, referral to urology needed.  Formed faxed on 4/25.  ? ?RN will continue to follow.  ?

## 2021-12-29 NOTE — Progress Notes (Signed)
Received call back from [facility], request for additional services for urology referral has been approved.  ? ?Per [facility] they will work on coordinating care with urology.  RN will continue to follow to ensure navigation needs are meet and continuity of care.  ?

## 2022-01-04 NOTE — Progress Notes (Signed)
RN left voicemail with [facility] @ [place], [place] Coordinator to follow up regarding referral for urology for patient to have consult.  ?

## 2022-01-05 NOTE — Progress Notes (Signed)
RN received call from [facility] updating that patient's referral request for urology is being handled by [facility] @ [facility] community care with direct number of [phone].  ? ?RN left voicemail with Varney Biles requesting call back with update on status of urology referral.  ?

## 2022-01-07 NOTE — Progress Notes (Signed)
RN received called back from [facility].  Per Varney Biles, Urology request has been approved, however, they are having barriers with reaching patient to confirm approval.  [facility] must speak with patient prior to moving forward with scheduling appointment at approved site of [facility].  ? ?RN spoke with patient.  Notified of PET PSMA results, verbalized understanding and agreement on moving forward with treatment plan as discussed.  Patient was given Keisha's contact information, and will call.  RN will continue follow.  ?

## 2022-01-12 NOTE — Progress Notes (Signed)
RN left voicemail with Varney Biles at [facility] to follow up with referral for urology.  ? ?RN attempted to reach patient to follow up as well, voicemail has not been set up at this time. Will continue to try to contact patient.  ?

## 2022-02-02 ENCOUNTER — Telehealth: Payer: Self-pay | Admitting: *Deleted

## 2022-02-02 NOTE — Telephone Encounter (Signed)
CALLED PATIENT TO ASK QUESTIONS, SPOKE WITH PATIENT 

## 2022-02-02 NOTE — Progress Notes (Signed)
Patient has consult with Dr. Tresa Moore @ [facility] on 6/5 to establish care for his prostate cancer, and consideration of brachytherapy.   Patient has finalized treatment decision to continue with ADT and brachytherapy.  Date currently pending at this time.   RN updated Glynn Octave @ [place] [phone] on status.  Will continue to follow to ensure brachytherapy is scheduled and assess navigation needs.

## 2022-02-03 ENCOUNTER — Other Ambulatory Visit: Payer: Self-pay | Admitting: Urology

## 2022-02-04 ENCOUNTER — Telehealth: Payer: Self-pay | Admitting: *Deleted

## 2022-02-04 NOTE — Telephone Encounter (Signed)
CALLED PATIENT TO INFORM OF PRE-SEED APPTS. FOR 03-25-22 AND HIS IMPLANT ON 04-30-22, SPOKE WITH PATIENT AND HE IS AWARE OF THESE APPTS.

## 2022-02-16 NOTE — Progress Notes (Signed)
Blake Wolfe @ [place] [phone] that brachytherapy has been scheduled for 04/30/2022.   Patient is aware of procedure date.  No additional needs at this time.

## 2022-03-23 ENCOUNTER — Telehealth: Payer: Self-pay | Admitting: *Deleted

## 2022-03-23 NOTE — Telephone Encounter (Signed)
Called patient to remind of pre-seed appts. for 03-25-22, spoke with patient and he is aware of these appts.

## 2022-03-25 ENCOUNTER — Ambulatory Visit
Admission: RE | Admit: 2022-03-25 | Discharge: 2022-03-25 | Disposition: A | Discharge status: Home / Self Care | Payer: No Typology Code available for payment source | Source: Ambulatory Visit | Attending: Radiation Oncology | Admitting: Radiation Oncology

## 2022-03-25 ENCOUNTER — Encounter (HOSPITAL_COMMUNITY)
Admission: RE | Admit: 2022-03-25 | Discharge: 2022-03-25 | Disposition: A | Discharge status: Home / Self Care | Payer: No Typology Code available for payment source | Source: Ambulatory Visit | Attending: Urology | Admitting: Urology

## 2022-03-25 ENCOUNTER — Encounter: Payer: Self-pay | Admitting: Urology

## 2022-03-25 ENCOUNTER — Other Ambulatory Visit: Payer: Self-pay

## 2022-03-25 ENCOUNTER — Ambulatory Visit
Admission: RE | Admit: 2022-03-25 | Discharge: 2022-03-25 | Disposition: A | Discharge status: Home / Self Care | Payer: No Typology Code available for payment source | Source: Ambulatory Visit | Attending: Urology | Admitting: Urology

## 2022-03-25 VITALS — Resp 19 | Ht 68.0 in | Wt 217.0 lb

## 2022-03-25 DIAGNOSIS — I10 Essential (primary) hypertension: Secondary | ICD-10-CM | POA: Insufficient documentation

## 2022-03-25 DIAGNOSIS — C61 Malignant neoplasm of prostate: Secondary | ICD-10-CM

## 2022-03-25 DIAGNOSIS — Z01810 Encounter for preprocedural cardiovascular examination: Secondary | ICD-10-CM | POA: Diagnosis present

## 2022-03-25 NOTE — Progress Notes (Signed)
Pre-seed appointment. I verified patient's identity and began nursing interview. No prostate discomfort reported at this time.  Meaningful use complete. No urinary management medications. Urology appt- Today 03/25/22 11:00am w/ [facility]  Resp 19   Ht '5\' 8"'$  (1.727 m)   Wt 217 lb (98.4 kg)   BMI 32.99 kg/m

## 2022-03-25 NOTE — Progress Notes (Signed)
Radiation Oncology         [phone] ________________________________  Outpatient Follow up- Pre-seed visit  Name: Blake Wolfe MRN: [id number]  Date: 03/25/2022  DOB: [birth year]  JO:INOMVE, [place]  Administration, [facility]   REFERRING PHYSICIAN: Administration, Veterans  DIAGNOSIS: 61 y.o. gentleman with Stage T1c adenocarcinoma of the prostate with Gleason score of 4+5, and PSA of 34.7.    ICD-10-CM   1. Malignant neoplasm of prostate ([place])  C61       HISTORY OF PRESENT ILLNESS: Blake Wolfe is a 61 y.o. male with a diagnosis of prostate cancer. He was noted to have an elevated PSA of 42.5 in 09/2021, by his primary care physician with the [facility]. Accordingly, he was referred for evaluation in urology by Dr. Carlota Raspberry on 10/27/21. He underwent CT A/P and bone scan on 10/30/21, both showing no acute abnormality or suspicious activity.   The patient proceeded to transrectal ultrasound with 12 biopsies of the prostate on 11/10/21 under the care of Dr. Domenica Fail, digital rectal examination was performed at that time revealing no nodules. The prostate volume measured 34 cc.  Out of 12 core biopsies, 10 were positive.  The maximum Gleason score was 4+5, and this was seen in all 6/6 right-sided cores. Additionally, Gleason 4+3 was seen in 4 of 6 left-sided cores.   He was referred to Dr. Merrie Roof, in medical oncology at the [facility] on 11/26/21 and met with Dr. Ali Lowe in radiation oncology at the [facility] on 12/04/21. A repeat PSA on 11/26/21 had decreased slightly but remained significantly elevated at 34.7. He was started on ADT with Firmagon on 12/15/2021.  He was also started on abiraterone (Zytiga) at that time.    The patient reviewed the biopsy results with his urologist and was kindly referred to [place] for discussion of potential radiation treatment options. We initially met the patient on 12/18/21 and he was most interested in proceeding with LT-ADT concurrent with 5 weeks of external radiation with  an upfront brachytherapy boost pending there was no evidence of metastatic disease on his upcoming PSMA at that time. The PSMA PET scan was performed on 12/25/21 and confirmed localized disease in the prostate only so he finalized his decision to proceed with treatment accordingly. He is tolerating the ADT and Zytiga fairly well despite hot flashes, decreased libido and decreased stamina. He is here today for his pre-seed boost procedure imaging for planning and to answer any additional questions he may have about this treatment.  PREVIOUS RADIATION THERAPY: No  PAST MEDICAL HISTORY: No past medical history on file.    PAST SURGICAL HISTORY: Past Surgical History:  Procedure Laterality Date   APPENDECTOMY     CHOLECYSTECTOMY      FAMILY HISTORY:  Family History  Problem Relation Age of Onset   Hypertension Mother    Diabetes Mother    Kidney failure Mother    Prostate cancer Father 2    SOCIAL HISTORY:  Social History   Socioeconomic History   Marital status: Married    Spouse name: Not on file   Number of children: Not on file   Years of education: Not on file   Highest education level: Not on file  Occupational History   Not on file  Tobacco Use   Smoking status: Never   Smokeless tobacco: Never  Substance and Sexual Activity   Alcohol use: No   Drug use: No   Sexual activity: Not on file  Other Topics Concern  Not on file  Social History Narrative   Not on file   Social Determinants of Health   Financial Resource Strain: Not on file  Food Insecurity: Not on file  Transportation Needs: Not on file  Physical Activity: Not on file  Stress: Not on file  Social Connections: Not on file  Intimate Partner Violence: Not on file    ALLERGIES: Patient has no known allergies.  MEDICATIONS:  Current Outpatient Medications  Medication Sig Dispense Refill   abiraterone acetate (ZYTIGA) 250 MG tablet TAKE FOUR TABLETS BY MOUTH DAILY FOR PROSTATE     amLODipine  (NORVASC) 10 MG tablet Take 1 tablet (10 mg total) by mouth daily. 180 tablet 3   Calcium Carb-Cholecalciferol 600-10 MG-MCG TABS Take 1 tablet by mouth daily.     methocarbamol (ROBAXIN) 500 MG tablet TAKE ONE TABLET BY MOUTH THREE TIMES A DAY FOR BACK PAIN     omeprazole (PRILOSEC) 20 MG capsule TAKE ONE CAPSULE BY MOUTH DAILY FOR ACID REFLUX (TAKE ON AN EMPTY STOMACH 30 MINUTES PRIOR TO A MEAL)     ondansetron (ZOFRAN) 8 MG tablet TAKE ONE TABLET BY MOUTH TWICE A DAY FOR NAUSEA     predniSONE (DELTASONE) 5 MG tablet Take 1 tablet by mouth daily.     traMADol (ULTRAM) 50 MG tablet Take 1 tablet (50 mg total) by mouth every 6 (six) hours as needed. 15 tablet 0   No current facility-administered medications for this encounter.    REVIEW OF SYSTEMS:   On review of systems, the patient reports that he is doing well overall. He denies any chest pain, shortness of breath, cough, fevers, chills, night sweats, unintended weight changes. He denies abdominal pain, or vomiting. He does report nausea and pain, rated 8/10 today, from his recent injection. His IPSS was 12, indicating moderate urinary symptoms. He reports urinary frequency, as well as some diarrhea. His SHIM was 17, indicating he has moderate erectile dysfunction. A complete review of systems is obtained and is otherwise negative.    PHYSICAL EXAM:  Wt Readings from Last 3 Encounters:  12/18/21 220 lb 3.2 oz (99.9 kg)  07/10/19 227 lb (103 kg)  07/06/19 227 lb (103 kg)   Temp Readings from Last 3 Encounters:  12/18/21 97.7 F (36.5 C)  10/03/19 98.5 F (36.9 C) (Oral)  07/06/19 98.2 F (36.8 C)   BP Readings from Last 3 Encounters:  12/18/21 134/80  10/03/19 (!) 158/95  07/06/19 (!) 157/99   Pulse Readings from Last 3 Encounters:  12/18/21 85  10/03/19 87  07/06/19 94    /10  In general this is a well appearing African-American male in no acute distress. He's alert and oriented x4 and appropriate throughout the  examination. Cardiopulmonary assessment is negative for acute distress, and he exhibits normal effort.     KPS = 100  100 - Normal; no complaints; no evidence of disease. 90   - Able to carry on normal activity; minor signs or symptoms of disease. 80   - Normal activity with effort; some signs or symptoms of disease. 26   - Cares for self; unable to carry on normal activity or to do active work. 60   - Requires occasional assistance, but is able to care for most of his personal needs. 50   - Requires considerable assistance and frequent medical care. 62   - Disabled; requires special care and assistance. 30   - Severely disabled; hospital admission is indicated although death not imminent.  41   - Very sick; hospital admission necessary; active supportive treatment necessary. 10   - Moribund; fatal processes progressing rapidly. 0     - Dead  Karnofsky DA, Abelmann [place], Craver LS and Burchenal [facility] [phone]) The use of the nitrogen mustards in the palliative treatment of carcinoma: with particular reference to bronchogenic carcinoma Cancer 1 634-56  LABORATORY DATA:  Lab Results  Component Value Date   WBC 6.8 07/01/2019   HGB 14.1 07/01/2019   HCT 44.6 07/01/2019   MCV 85.6 07/01/2019   PLT 268 07/01/2019   Lab Results  Component Value Date   NA 136 07/01/2019   K 3.7 07/01/2019   CL 102 07/01/2019   CO2 24 07/01/2019   Lab Results  Component Value Date   ALT 33 07/01/2019   AST 26 07/01/2019   ALKPHOS 54 07/01/2019   BILITOT 0.1 (L) 07/01/2019     RADIOGRAPHY: No results found.    IMPRESSION/PLAN: 1. 61 y.o. gentleman with Stage T1c adenocarcinoma of the prostate with Gleason Score of 4+5, and PSA of 34.7.  He has elected to proceed with LT-ADT concurrent with 5 weeks of external radiation with an upfront brachytherapy boost and use of SpaceOAR gel to reduce rectal toxicity from radiotherapy. We reviewed the risks, benefits, short and long-term effects associated with this  treatment and discussed the role of SpaceOAR in reducing the rectal toxicity associated with radiotherapy.  He appears to have a good understanding of his disease and our treatment recommendations which are of curative intent.  He was encouraged to ask questions that were answered to his stated satisfaction. He has freely signed written consent to proceed today in the office and a copy of this document will be placed in his medical record. His seed boost procedure is tentatively scheduled for 04/30/22 in collaboration with Dr. Tresa Moore and we will see him back for his post-procedure visit approximately 3 weeks thereafter, at which time we will proceed with CT [facility] prostate in anticipation of beginning his 5 week course of daily radiation shortly therafter. We look forward to continuing to participate in his care. He knows that he is welcome to call with any questions or concerns at any time in the interim.  I personally spent 30 minutes in this encounter including chart review, reviewing radiological studies, meeting face-to-face with the patient, entering orders and completing documentation.    Nicholos Johns, MMS, PA-C [place] [facility] [place]: [phone]  Fax: [phone]

## 2022-03-27 ENCOUNTER — Ambulatory Visit: Payer: No Typology Code available for payment source | Admitting: Radiation Oncology

## 2022-03-28 NOTE — Progress Notes (Signed)
  Radiation Oncology         [phone] ________________________________  Name: Blake Wolfe MRN: [id number]  Date: 03/25/2022  DOB: 09-30-1960  SIMULATION AND TREATMENT PLANNING NOTE PUBIC ARCH STUDY  VW:PVXYIA, [place]  Administration, Veterans  DIAGNOSIS: 61 y.o. gentleman with Stage T1c adenocarcinoma of the prostate with Gleason score of 4+5, and PSA of 34.7.  Oncology History  Malignant neoplasm of prostate ([place])  11/10/2021 Cancer Staging   Staging form: Prostate, AJCC 8th Edition - Clinical stage from 11/10/2021: Stage IIIC (cT1c, cN0, cM0, PSA: 34.7, Grade Group: 5) - Signed by Freeman Caldron, PA-C on 12/18/2021 Histopathologic type: Adenocarcinoma, NOS Stage prefix: Initial diagnosis Prostate specific antigen (PSA) range: 20 or greater Gleason primary pattern: 4 Gleason secondary pattern: 5 Gleason score: 9 Histologic grading system: 5 grade system Number of biopsy cores examined: 12 Number of biopsy cores positive: 10 Location of positive needle core biopsies: Both sides   12/18/2021 Initial Diagnosis   Malignant neoplasm of prostate ([place])       ICD-10-CM   1. Malignant neoplasm of prostate ([place])  C61       COMPLEX SIMULATION:  The patient presented today for evaluation for possible prostate seed implant. He was brought to the radiation planning suite and placed supine on the CT couch. A 3-dimensional image study set was obtained in upload to the planning computer. There, on each axial slice, I contoured the prostate gland. Then, using three-dimensional radiation planning tools I reconstructed the prostate in view of the structures from the transperineal needle pathway to assess for possible pubic arch interference. In doing so, I did not appreciate any pubic arch interference. Also, the patient's prostate volume was estimated based on the drawn structure. The volume was 22 cc.  Given the pubic arch appearance and prostate volume, patient remains a good  candidate to proceed with prostate seed implant. Today, he freely provided informed written consent to proceed.    PLAN: The patient will undergo prostate seed implant boost to be followed by IMRT.   ________________________________  Sheral Apley. Tammi Klippel, M.D.

## 2022-04-16 ENCOUNTER — Encounter (HOSPITAL_BASED_OUTPATIENT_CLINIC_OR_DEPARTMENT_OTHER): Payer: Self-pay | Admitting: Urology

## 2022-04-16 NOTE — Progress Notes (Signed)
Spoke w/ via phone for pre-op interview--- patient  Lab needs dos----  POCT CBG, Istat, EKG, CXR (orders placed on 02/04/22 by original PAT RN Zelphia [place])             Lab results------ in [place] test -----patient states asymptomatic no test needed Arrive at ------- 1100 on Friday 04/30/22 NPO after MN NO Solid Food.  Clear liquids from MN until--- 1000 on 04/30/22 Med rec completed Medications to take morning of surgery ----- robaxin prn, tramadol prn, zofran prn Diabetic medication ----- n/a Patient instructed no nail polish to be worn day of surgery Patient instructed to bring photo id and insurance card day of surgery Patient aware to have Driver (ride ) / caregiver    for 24 hours after surgery - Marko Skalski (wife) [phone] Patient Special Instructions ----- bring CPAP DOS Pre-Op special Istructions ----- take fleet enema morning of surgery per MD order Patient verbalized understanding of instructions that were given at this phone interview. Patient denies shortness of breath, chest pain, fever, cough at this phone interview.  Pt had hospital admission 07/06/19 for CP, syncope, DOE. EF of 60-65% with mitral valve regurgitation, cardiac enzyme negative, EKG NSR,  CT head negative, diagnosis pleuritic chest pain and HTN. Pt states no further need for f/u with cardiology, no CP, DOE, or syncope since incident in 2020. EKG on 03/05/22 NSR.   Lyndel Pleasure, RN

## 2022-04-29 ENCOUNTER — Telehealth: Payer: Self-pay | Admitting: *Deleted

## 2022-04-29 NOTE — Telephone Encounter (Signed)
Called patient to remind of procedure for 04-30-22, spoke with patient and he is aware of this procedure

## 2022-04-29 NOTE — Anesthesia Preprocedure Evaluation (Addendum)
Anesthesia Evaluation  Patient identified by MRN, date of birth, ID band Patient awake    Reviewed: Allergy & Precautions, NPO status , Patient's Chart, lab work & pertinent test results  Airway Mallampati: I  TM Distance: >3 FB Neck ROM: Full    Dental no notable dental hx. (+) Upper Dentures, Lower Dentures, Dental Advisory Given   Pulmonary sleep apnea and Continuous Positive Airway Pressure Ventilation ,    Pulmonary exam normal breath sounds clear to auscultation       Cardiovascular hypertension, Pt. on medications Normal cardiovascular exam Rhythm:Regular Rate:Normal     Neuro/Psych PSYCHIATRIC DISORDERS Anxiety negative neurological ROS     GI/Hepatic Neg liver ROS, GERD  ,  Endo/Other    Renal/GU negative Renal ROS   Prostate CA    Musculoskeletal   Abdominal   Peds  Hematology negative hematology ROS (+)   Anesthesia Other Findings   Reproductive/Obstetrics negative OB ROS                            Anesthesia Physical Anesthesia Plan  ASA: 3  Anesthesia Plan: General   Post-op Pain Management: Tylenol PO (pre-op)* and Dilaudid IV   Induction: Intravenous  PONV Risk Score and Plan: 3 and Treatment may vary due to age or medical condition, Midazolam and Ondansetron  Airway Management Planned: Oral ETT  Additional Equipment: None  Intra-op Plan:   Post-operative Plan: Extubation in OR  Informed Consent: I have reviewed the patients History and Physical, chart, labs and discussed the procedure including the risks, benefits and alternatives for the proposed anesthesia with the patient or authorized representative who has indicated his/her understanding and acceptance.     Dental advisory given  Plan Discussed with:   Anesthesia Plan Comments:        Anesthesia Quick Evaluation

## 2022-04-30 ENCOUNTER — Ambulatory Visit (HOSPITAL_BASED_OUTPATIENT_CLINIC_OR_DEPARTMENT_OTHER)
Admission: RE | Admit: 2022-04-30 | Discharge: 2022-04-30 | Disposition: A | Discharge status: Home / Self Care | Payer: No Typology Code available for payment source | Source: Ambulatory Visit | Attending: Urology | Admitting: Urology

## 2022-04-30 ENCOUNTER — Other Ambulatory Visit: Payer: Self-pay

## 2022-04-30 ENCOUNTER — Encounter (HOSPITAL_BASED_OUTPATIENT_CLINIC_OR_DEPARTMENT_OTHER)
Admission: RE | Disposition: A | Discharge status: Home / Self Care | Payer: Self-pay | Source: Ambulatory Visit | Attending: Urology

## 2022-04-30 ENCOUNTER — Ambulatory Visit (HOSPITAL_BASED_OUTPATIENT_CLINIC_OR_DEPARTMENT_OTHER): Payer: No Typology Code available for payment source | Admitting: Anesthesiology

## 2022-04-30 ENCOUNTER — Encounter (HOSPITAL_BASED_OUTPATIENT_CLINIC_OR_DEPARTMENT_OTHER): Payer: Self-pay | Admitting: Urology

## 2022-04-30 ENCOUNTER — Ambulatory Visit (HOSPITAL_COMMUNITY): Payer: No Typology Code available for payment source

## 2022-04-30 DIAGNOSIS — G4733 Obstructive sleep apnea (adult) (pediatric): Secondary | ICD-10-CM | POA: Insufficient documentation

## 2022-04-30 DIAGNOSIS — C61 Malignant neoplasm of prostate: Secondary | ICD-10-CM | POA: Diagnosis present

## 2022-04-30 DIAGNOSIS — I10 Essential (primary) hypertension: Secondary | ICD-10-CM | POA: Diagnosis not present

## 2022-04-30 DIAGNOSIS — Z9989 Dependence on other enabling machines and devices: Secondary | ICD-10-CM | POA: Diagnosis not present

## 2022-04-30 DIAGNOSIS — Z79899 Other long term (current) drug therapy: Secondary | ICD-10-CM | POA: Insufficient documentation

## 2022-04-30 DIAGNOSIS — Z01818 Encounter for other preprocedural examination: Secondary | ICD-10-CM

## 2022-04-30 HISTORY — PX: RADIOACTIVE SEED IMPLANT: SHX5150

## 2022-04-30 HISTORY — DX: Gastro-esophageal reflux disease without esophagitis: K21.9

## 2022-04-30 HISTORY — DX: Unspecified hearing loss, left ear: H91.92

## 2022-04-30 HISTORY — PX: SPACE OAR INSTILLATION: SHX6769

## 2022-04-30 HISTORY — DX: Obstructive sleep apnea (adult) (pediatric): G47.33

## 2022-04-30 HISTORY — DX: Essential (primary) hypertension: I10

## 2022-04-30 LAB — POCT I-STAT, CHEM 8
BUN: 13 mg/dL (ref 8–23)
Calcium, Ion: 1.19 mmol/L (ref 1.15–1.40)
Chloride: 103 mmol/L (ref 98–111)
Creatinine, Ser: 1.1 mg/dL (ref 0.61–1.24)
Glucose, Bld: 99 mg/dL (ref 70–99)
HCT: 41 % (ref 39.0–52.0)
Hemoglobin: 13.9 g/dL (ref 13.0–17.0)
Potassium: 3.6 mmol/L (ref 3.5–5.1)
Sodium: 141 mmol/L (ref 135–145)
TCO2: 24 mmol/L (ref 22–32)

## 2022-04-30 SURGERY — INSERTION, RADIATION SOURCE, PROSTATE
Anesthesia: General | Site: Prostate

## 2022-04-30 MED ORDER — PROPOFOL 10 MG/ML IV BOLUS
INTRAVENOUS | Status: DC | PRN
  Administered 2022-04-30: 170 mg via INTRAVENOUS
  Administered 2022-04-30: 20 mg via INTRAVENOUS

## 2022-04-30 MED ORDER — TRAMADOL HCL 50 MG PO TABS: 50.0000 mg | ORAL_TABLET | Freq: Four times a day (QID) | ORAL | 0 refills | Status: AC | PRN

## 2022-04-30 MED ORDER — OXYCODONE HCL 5 MG PO TABS: 5.0000 mg | ORAL_TABLET | Freq: Once | ORAL | Status: DC | PRN

## 2022-04-30 MED ORDER — ONDANSETRON HCL 4 MG/2ML IJ SOLN: 4.0000 mg | Freq: Once | INTRAMUSCULAR | Status: DC | PRN

## 2022-04-30 MED ORDER — OXYCODONE HCL 5 MG/5ML PO SOLN: 5.0000 mg | Freq: Once | ORAL | Status: DC | PRN

## 2022-04-30 MED ORDER — MIDAZOLAM HCL 2 MG/2ML IJ SOLN
INTRAMUSCULAR | Status: AC
  Filled 2022-04-30: qty 2

## 2022-04-30 MED ORDER — FLEET ENEMA 7-19 GM/118ML RE ENEM: 1.0000 | ENEMA | Freq: Once | RECTAL | Status: DC

## 2022-04-30 MED ORDER — LIDOCAINE 2% (20 MG/ML) 5 ML SYRINGE
INTRAMUSCULAR | Status: DC | PRN
  Administered 2022-04-30: 100 mg via INTRAVENOUS

## 2022-04-30 MED ORDER — FENTANYL CITRATE (PF) 100 MCG/2ML IJ SOLN
INTRAMUSCULAR | Status: AC
  Filled 2022-04-30: qty 2

## 2022-04-30 MED ORDER — HYDROMORPHONE HCL 1 MG/ML IJ SOLN: 0.2500 mg | INTRAMUSCULAR | Status: DC | PRN

## 2022-04-30 MED ORDER — ONDANSETRON HCL 4 MG/2ML IJ SOLN
INTRAMUSCULAR | Status: AC
  Filled 2022-04-30: qty 2

## 2022-04-30 MED ORDER — PROPOFOL 10 MG/ML IV BOLUS
INTRAVENOUS | Status: AC
  Filled 2022-04-30: qty 20

## 2022-04-30 MED ORDER — ROCURONIUM BROMIDE 10 MG/ML (PF) SYRINGE
PREFILLED_SYRINGE | INTRAVENOUS | Status: AC
  Filled 2022-04-30: qty 10

## 2022-04-30 MED ORDER — CIPROFLOXACIN IN D5W 400 MG/200ML IV SOLN
400.0000 mg | INTRAVENOUS | Status: AC
  Administered 2022-04-30: 400 mg via INTRAVENOUS

## 2022-04-30 MED ORDER — MIDAZOLAM HCL 5 MG/5ML IJ SOLN
INTRAMUSCULAR | Status: DC | PRN
  Administered 2022-04-30: 2 mg via INTRAVENOUS

## 2022-04-30 MED ORDER — IOHEXOL 300 MG/ML  SOLN
INTRAMUSCULAR | Status: DC | PRN
  Administered 2022-04-30: 7 mL

## 2022-04-30 MED ORDER — SODIUM CHLORIDE (PF) 0.9 % IJ SOLN
INTRAMUSCULAR | Status: DC | PRN
  Administered 2022-04-30: 10 mL

## 2022-04-30 MED ORDER — FENTANYL CITRATE (PF) 100 MCG/2ML IJ SOLN
INTRAMUSCULAR | Status: DC | PRN
Start: 2022-04-30 — End: 2022-04-30
  Administered 2022-04-30: 25 ug via INTRAVENOUS
  Administered 2022-04-30: 100 ug via INTRAVENOUS
  Administered 2022-04-30: 25 ug via INTRAVENOUS

## 2022-04-30 MED ORDER — DEXAMETHASONE SODIUM PHOSPHATE 10 MG/ML IJ SOLN
INTRAMUSCULAR | Status: AC
  Filled 2022-04-30: qty 1

## 2022-04-30 MED ORDER — CIPROFLOXACIN IN D5W 400 MG/200ML IV SOLN
INTRAVENOUS | Status: AC
  Filled 2022-04-30: qty 200

## 2022-04-30 MED ORDER — STERILE WATER FOR IRRIGATION IR SOLN
Status: DC | PRN
  Administered 2022-04-30: 3 mL

## 2022-04-30 MED ORDER — ONDANSETRON HCL 4 MG/2ML IJ SOLN
INTRAMUSCULAR | Status: DC | PRN
  Administered 2022-04-30: 4 mg via INTRAVENOUS

## 2022-04-30 MED ORDER — SODIUM CHLORIDE 0.9 % IV SOLN
INTRAVENOUS | Status: AC | PRN
  Administered 2022-04-30: 1000 mL

## 2022-04-30 MED ORDER — AMISULPRIDE (ANTIEMETIC) 5 MG/2ML IV SOLN: 10.0000 mg | Freq: Once | INTRAVENOUS | Status: DC | PRN

## 2022-04-30 MED ORDER — ACETAMINOPHEN 10 MG/ML IV SOLN: 1000.0000 mg | Freq: Once | INTRAVENOUS | Status: DC | PRN

## 2022-04-30 MED ORDER — LACTATED RINGERS IV SOLN: INTRAVENOUS | Status: DC

## 2022-04-30 MED ORDER — ROCURONIUM BROMIDE 10 MG/ML (PF) SYRINGE
PREFILLED_SYRINGE | INTRAVENOUS | Status: DC | PRN
  Administered 2022-04-30: 60 mg via INTRAVENOUS

## 2022-04-30 MED ORDER — TAMSULOSIN HCL 0.4 MG PO CAPS: 0.4000 mg | ORAL_CAPSULE | Freq: Every day | ORAL | 11 refills | Status: AC | PRN

## 2022-04-30 MED ORDER — SUGAMMADEX SODIUM 200 MG/2ML IV SOLN
INTRAVENOUS | Status: DC | PRN
  Administered 2022-04-30 (×2): 100 mg via INTRAVENOUS

## 2022-04-30 MED ORDER — DEXAMETHASONE SODIUM PHOSPHATE 10 MG/ML IJ SOLN
INTRAMUSCULAR | Status: DC | PRN
  Administered 2022-04-30: 10 mg via INTRAVENOUS

## 2022-04-30 SURGICAL SUPPLY — 48 items
BAG DRN RND TRDRP ANRFLXCHMBR (UROLOGICAL SUPPLIES) ×2
BAG URINE DRAIN 2000ML AR STRL (UROLOGICAL SUPPLIES) ×2 IMPLANT
BLADE CLIPPER SENSICLIP SURGIC (BLADE) ×2 IMPLANT
CATH FOLEY 2WAY SLVR  5CC 16FR (CATHETERS) ×2
CATH FOLEY 2WAY SLVR 5CC 16FR (CATHETERS) ×2 IMPLANT
CATH ROBINSON RED A/P 16FR (CATHETERS) IMPLANT
CATH ROBINSON RED A/P 20FR (CATHETERS) ×2 IMPLANT
CLOTH BEACON ORANGE TIMEOUT ST (SAFETY) ×2 IMPLANT
CNTNR URN SCR LID CUP LEK RST (MISCELLANEOUS) ×2 IMPLANT
CONT SPEC 4OZ STRL OR WHT (MISCELLANEOUS) ×2
COVER BACK TABLE 60X90IN (DRAPES) ×2 IMPLANT
COVER MAYO STAND STRL (DRAPES) ×2 IMPLANT
DRSG TEGADERM 4X4.75 (GAUZE/BANDAGES/DRESSINGS) ×2 IMPLANT
DRSG TEGADERM 8X12 (GAUZE/BANDAGES/DRESSINGS) ×2 IMPLANT
GAUZE SPONGE 4X4 12PLY STRL (GAUZE/BANDAGES/DRESSINGS) IMPLANT
GEL ULTRASOUND 20GR AQUASONIC (MISCELLANEOUS) ×4 IMPLANT
GLOVE BIO SURGEON STRL SZ 6 (GLOVE) IMPLANT
GLOVE BIO SURGEON STRL SZ 6.5 (GLOVE) IMPLANT
GLOVE BIO SURGEON STRL SZ7.5 (GLOVE) ×2 IMPLANT
GLOVE BIO SURGEON STRL SZ8 (GLOVE) IMPLANT
GLOVE BIOGEL PI IND STRL 6.5 (GLOVE) IMPLANT
GLOVE BIOGEL PI INDICATOR 6.5 (GLOVE)
GLOVE SURG ORTHO 8.5 STRL (GLOVE) ×2 IMPLANT
GLOVE SURG SS PI 6.5 STRL IVOR (GLOVE) IMPLANT
GOWN STRL REUS W/TWL LRG LVL3 (GOWN DISPOSABLE) ×2 IMPLANT
GRID BRACH TEMP 18GA 2.8X3X.75 (MISCELLANEOUS) ×2 IMPLANT
HOLDER FOLEY CATH W/STRAP (MISCELLANEOUS) IMPLANT
IMPL SPACEOAR VUE SYSTEM (Spacer) ×2 IMPLANT
IMPLANT SPACEOAR VUE SYSTEM (Spacer) ×2 IMPLANT
IV NS 1000ML (IV SOLUTION) ×2
IV NS 1000ML BAXH (IV SOLUTION) ×2 IMPLANT
KIT TURNOVER CYSTO (KITS) ×2 IMPLANT
NDL BRACHY 18G 5PK (NEEDLE) ×8 IMPLANT
NDL BRACHY 18G SINGLE (NEEDLE) IMPLANT
NDL PK MORGANSTERN STABILIZ (NEEDLE) ×2 IMPLANT
NEEDLE BRACHY 18G 5PK (NEEDLE) ×8 IMPLANT
NEEDLE BRACHY 18G SINGLE (NEEDLE) IMPLANT
NEEDLE PK MORGANSTERN STABILIZ (NEEDLE) ×2 IMPLANT
PACK CYSTO (CUSTOM PROCEDURE TRAY) ×2 IMPLANT
SHEATH ULTRASOUND LF (SHEATH) IMPLANT
SHEATH ULTRASOUND LTX NONSTRL (SHEATH) IMPLANT
SUT BONE WAX W31G (SUTURE) IMPLANT
SYR 10ML LL (SYRINGE) ×2 IMPLANT
SYR CONTROL 10ML LL (SYRINGE) ×2 IMPLANT
TOWEL OR 17X26 10 PK STRL BLUE (TOWEL DISPOSABLE) ×2 IMPLANT
UNDERPAD 30X36 HEAVY ABSORB (UNDERPADS AND DIAPERS) ×4 IMPLANT
WATER STERILE IRR 500ML POUR (IV SOLUTION) ×2 IMPLANT
brachysource I-125 seeds IMPLANT

## 2022-04-30 NOTE — Discharge Instructions (Addendum)
1 - You may have urinary urgency (bladder spasms) and bloody urine on / off for up to 2 weeks. This is normal.  2 - Call MD or go to ER for fever >102, severe pain / nausea / vomiting not relieved by medications, or acute change in medical status  PROSTATE CANCER TREATMENT WITH RADIOACTIVE IODINE-125 SEED IMPLANT  This instruction sheet is intended to discuss implantation of Iodine-125 seeds as treatment for cancer of the prostate. It will explain in detail what you may expect from this treatment and what precautions are necessary as a result of the treatment. Iodine-125 emits a relatively low energy radiation. The radioactive seeds are surgically implanted directly into the prostate gland. Most of the radiation is contained within the prostate gland. A very small amount is present outside the body.The precautions that we ask you to take are to ensure that those around you are protected from unnecessary radiation. The principles of radiation safety that you need to understand are:  DISTANCE: The further a person is from the radioactive implant the less radiation they will be receiving. The amount of radiation received falls off quite rapidly with distance. More specific guidelines are given in the table on the last page.  TIME: The amount of radiation a person is exposed to is directly proportional to the amount of time that is spent in close proximity to the radioactive implant. Very little radiation will be received during short periods. See the table on the last page for more specific guideline.  CHILDREN UNDER AGE 71 Children should not be allowed to sit on your lap or otherwise be in very close contact for more than a few minutes for the first 6-8 weeks following the implant. You may affectionately greet (hug/kiss) a child for a short period of time, but remember, the longer you are in close proximity with that child the more radiation they are being exposed to. At a distance of 6 feet there is  no limit to the length of time you may spend together. See specific guidelines on the last page.  PREGNANT OR POSSIBLY PREGNANT WOMEN Pregnant women should avoid prolonged close physical contact with you for the first 6-8 weeks after implant. At a distance of 6 feet there is no limit to the length of time you may spend together. Pregnant women or possibly pregnant women can safely be in close contact with you for a limited period of time. See the last page for guidelines.  FAMILY RELATIONS You may sleep in the same bed as your partner (provided she is not pregnant or under the age of 77). Sexual intercourse, using a condom, may be resumed 2 weeks after the implant. Your semen may be discolored, dark brown or black. This is normal and is the result of bleeding that may have occurred during the implant. After 3-4 weeks it will not be necessary to use a condom.  DAILY ACTIVITIES You may resume normal activities in a few days (example: work, shopping, church) without the risk of harmful radiation exposure to those around you provided you keep in mind the time and distance precautions. Objects that you touch or item that you use do not become radioactive. Linens, clothing, tableware, and dishes may be used by other persons without special precautions. Your bodily wastes (urine and stool) are not radioactive.  SPECIAL PRECAUTIONS It is possible to lose implanted Iodine-125 seed(s) through urination. Although it is possible to pass seeds indefinitely, it is most likely to occur immediately after catheter  removal. To prevent this from happening the catheter that was in place during the implant procedure is removed immediately after the implant and a cystoscopy procedure is performed. The process of removing the catheter and the cystoscopy procedure should dislodge and remove any seeds that are not firmly imbedded in the prostate tissue. However, you should watch for seeds if/when you remove your catheter at  home. The seeds are silver colored and the size of a grain of rice. In the unlikely event that a seed is seen after urination, simply flush the seed down the toilet. The seed should not be handled with your fingers, not even with a glove or napkin. A spoon or tweezers can be used to pick up a seed. The Radiation Oncology department is open Monday - Friday from 8:00 am to 5:30 pm with a Radiation Oncologist on call at all times. He or she may be reached by calling [phone]. If you are to be hospitalized or if death should occur, your family should notify the Runner, broadcasting/film/video.  SIDE EFFECTS There are very few side effects associate with the implant procedure. Minor burning with urination, weak stream, hesitancy, intermittency, frequency, mild pain or feeling unable to pass your urine freely are common and usually stop in one to four months. If these symptoms are extremely uncomfortable, contact your physician.  RADIATION SAFETY GUIDELINES PROSTATE CANCER TREATMENT WITH RADIOACTIVE IODINE-125 SEED IMPLANT  The following guidelines will limit exposure to less than naturally occurring background radiation.  PERSONS AGE 39-45 (if able to become pregnant)  FOR 8 WEEKS FOLLOWING IMPLANT  At a distance of 1 foot: limit time to less than 2 hours/week At a distance of 3 feet: limit time to 20 hours/week At a distance of 6 feet: no restrictions  AFTER 8 WEEKS No restrictions  CHILDREN UNDER AGE 39, PREGNANT WOMEN OR POSSIBLY PREGNANT WOMEN  FOR 8 WEEKS FOLLOWING IMPLANT At a distance of 1 foot: limit time to 10 minutes/week At a distance of 3 feet: limit time to 2 hours/week At a distance of 6 feet: no restrictions  AFTER 8 WEEKS No restrictions  PERSONS OVER THE AGE OF 45 AND DO NOT EXPECT TO HAVE ANY MORE CHILDREN No restrictions   Post Anesthesia Home Care Instructions  Activity: Get plenty of rest for the remainder of the day. A responsible individual must stay with you  for 24 hours following the procedure.  For the next 24 hours, DO NOT: -Drive a car -Paediatric nurse -Drink alcoholic beverages -Take any medication unless instructed by your physician -Make any legal decisions or sign important papers.  Meals: Start with liquid foods such as gelatin or soup. Progress to regular foods as tolerated. Avoid greasy, spicy, heavy foods. If nausea and/or vomiting occur, drink only clear liquids until the nausea and/or vomiting subsides. Call your physician if vomiting continues.  Special Instructions/Symptoms: Your throat may feel dry or sore from the anesthesia or the breathing tube placed in your throat during surgery. If this causes discomfort, gargle with warm salt water. The discomfort should disappear within 24 hours.

## 2022-04-30 NOTE — Op Note (Unsigned)
Blake Wolfe, Blake Wolfe MEDICAL RECORD NO: [id number] ACCOUNT NO: [id number] DATE OF BIRTH: [birth year] FACILITY: [facility] LOCATION: [facility] PHYSICIAN: Alexis Frock, MD  Operative Report   DATE OF PROCEDURE: 04/30/2022  PREOPERATIVE DIAGNOSIS:  Very high risk prostate cancer.  PROCEDURES:  1.  Brachytherapy, prostate seed implantation. 2.  Cystoscopy. 3.  SpaceOAR implantation.  ESTIMATED BLOOD LOSS:  Nil.  COMPLICATIONS:  None.  SPECIMEN:  None.  FINDINGS:  1.  Unremarkable urinary bladder after radiation seed implantation. 2.  Successful placement of 10 mL of SpaceOAR gel matrix in the prerectal space.  RADIATION PARAMETERS:  55 seeds over 16 catheters delivering 110 gray to the prostate.  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  This was diagnosed at the [place].  He is undergoing multiple motile curative intent therapy with androgen deprivation plus  androgen symphysis inhibitors plus radiation external beam of the prostate brachy boost.  He has received initial androgen deprivation and presents for his prostate brachytherapy boost implantation today for SpaceOAR.  Informed consent was obtained and  placed in medical record.  PROCEDURE DETAILS:  The patient is being [employer] verified, procedure being prostate brachytherapy seed implantation with cystoscopy and SpaceOAR was confirmed.  Procedure timeout was performed.  Intravenous antibiotics were administered.  General  anesthesia was induced.  The patient was placed into medium lithotomy position and radiation planning and transrectal ultrasound was performed as per separate Radiation Oncology note, who prescribed planning, called for 55 seeds over 16 catheters.  Foley  catheter had been placed. The catheters were then placed as per the plan in anterior to posterior direction to allow optimal ultrasound visualization of the prostate.   Following placement of all 16 catheters, spot fluoroscopic imaging was obtained,  which revealed brachytherapy seeds in expected location.  The grid and transrectal ultrasound probe was removed and the ultrasound was taken off anterior traction.  Attention was directed at [facility] implantation.  The introducer SpaceOAR needle was placed in transperineal location approximately 1 cm anterior to the anus and the tip of the needle was placed into the anterior prerectal fat stripe mid gland location.  This was corroborated with 2 mL of sterile saline  injection.  Next, 10 mL of the SpaceOAR gel matrix was placed over 10 seconds as per manufacturer guidelines.  There was excellent posterior displacement of the anterior rectal wall as anticipated.  In situ Foley catheter was removed and  cystourethroscopy was then performed using a 16-French flexible cystoscope.  Inspection of anterior and posterior urethra was unremarkable.  Inspection of the bladder revealed no diverticula, calcifications, papillary lesions.  There was no evidence of  intraluminal seeds whatsoever.  The scope was retroflexed.  No additional findings were noted.  Bladder was left partially full and the procedure was terminated.  The patient tolerated procedure well, no immediate perioperative complications.  The  patient was taken to postanesthesia care in stable condition.  We will plan for discharge home after void.   MUK D: 04/30/2022 2:08:01 pm T: 04/30/2022 10:07:00 pm  JOB: [id number]/ [id number]

## 2022-04-30 NOTE — Transfer of Care (Signed)
Immediate Anesthesia Transfer of Care Note  Patient: Blake Wolfe  Procedure(s) Performed: RADIOACTIVE SEED IMPLANT/BRACHYTHERAPY IMPLANT (Prostate) SPACE OAR INSTILLATION (Perineum)  Patient Location: PACU  Anesthesia Type:General  Level of Consciousness: awake, alert , oriented and patient cooperative  Airway & Oxygen Therapy: Patient Spontanous Breathing  Post-op Assessment: Report given to RN and Post -op Vital signs reviewed and stable  Post vital signs: Reviewed and stable  Last Vitals:  Vitals Value Taken Time  BP 132/90 04/30/22 1415  Temp    Pulse    Resp 10 04/30/22 1417  SpO2    Vitals shown include unvalidated device data.  Last Pain:  Vitals:   04/30/22 1120  TempSrc: Oral         Complications: No notable events documented.

## 2022-04-30 NOTE — Anesthesia Procedure Notes (Signed)
Procedure Name: Intubation Date/Time: 04/30/2022 1:07 PM  Performed by: Rogers Blocker, CRNAPre-anesthesia Checklist: Patient identified, Emergency Drugs available, Suction available and Patient being monitored Patient Re-evaluated:Patient Re-evaluated prior to induction Oxygen Delivery Method: Circle System Utilized Preoxygenation: Pre-oxygenation with 100% oxygen Induction Type: IV induction Ventilation: Mask ventilation without difficulty Laryngoscope Size: Mac and 4 Grade View: Grade I Tube type: Oral Tube size: 7.0 mm Number of attempts: 1 Airway Equipment and Method: Stylet and Bite block Placement Confirmation: ETT inserted through vocal cords under direct vision, positive ETCO2 and breath sounds checked- equal and bilateral Secured at: 22 cm Tube secured with: Tape Dental Injury: Teeth and Oropharynx as per pre-operative assessment

## 2022-04-30 NOTE — H&P (Signed)
Blake Wolfe is an 61 y.o. male.    Chief Complaint: Pre-OP Prostate Brachytherapy / SPACE-OAR Implantation  HPI:   1 - High Risk Prostate Cancer - 10/12 cores Grade 5 cancer at [facility] 10/2021 on eval PSA 45. CT, BX, PET-CT w/o mets. Prostate vol 7m without median on CT 2023. [facility] Firmagon [facility] + 2 years androgen deprivation. He is only 89 Has met with rad-onc MTammi Klippelwho recs brachy bosst + external beam.   Recent Coures:  [facility]   PMH sig for open appy, lap chole. No CV disease / blood thinners. 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 His P[facility] is [facility]uncan [facility] [place]   Today " Blake Wolfe " is seen to proceed with prostate brachytherapy seed implantation with SPACE-OAR. 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 He has never used smokeless tobacco. He reports that he does not drink alcohol and does not use drugs.  Allergies: No Known Allergies  No medications prior to admission.    No results found for this or any previous visit (from the past 48 hour(s)). No results found.  Review of Systems  Constitutional:  Negative for chills and fever.  All other systems reviewed and are negative.   Height 5' 8"  (1.727 m), weight 90.7 kg. Physical Exam Vitals reviewed.  HENT:     Head: Normocephalic.     Nose: Nose normal.  Eyes:     Pupils: Pupils are equal, round, and reactive to  light.  Cardiovascular:     Rate and Rhythm: Normal rate.  Pulmonary:     Effort: Pulmonary effort is normal.  Abdominal:     General: Abdomen is flat.  Genitourinary:    Comments: No CVAT at present Musculoskeletal:        General: Normal range of motion.     Cervical back: Normal range of motion.  Skin:    General: Skin is warm.  Neurological:     General: No focal deficit present.     Mental Status: He is alert.  Psychiatric:        Mood and Affect: Mood normal.      Assessment/Plan  Proceed as planned with prostate brachytherapy seed impantation with SPACE-OAR as part of aggressive mulit-modal therapy for prostate cancer. Risks, benefits, alternatives, expected peri-op course discussed previously and reiterated today.   TAlexis Frock MD 04/30/2022, 7:03 AM

## 2022-04-30 NOTE — Brief Op Note (Signed)
04/30/2022  2:02 PM  PATIENT:  Blake Wolfe  61 y.o. male  PRE-OPERATIVE DIAGNOSIS:  PROSTATE CANCER  POST-OPERATIVE DIAGNOSIS:  PROSTATE CANCER  PROCEDURE:  Procedure(s): RADIOACTIVE SEED IMPLANT/BRACHYTHERAPY IMPLANT (N/A) SPACE OAR INSTILLATION (N/A)  SURGEON:  Surgeon(s) and Role:    * Alexis Frock, MD - Primary    * Tyler Pita, MD  PHYSICIAN ASSISTANT:   ASSISTANTS: none   ANESTHESIA:   general  EBL:  2 mL   BLOOD ADMINISTERED:none  DRAINS: none   LOCAL MEDICATIONS USED:  NONE  SPECIMEN:  No Specimen  DISPOSITION OF SPECIMEN:  N/A  COUNTS:  YES  TOURNIQUET:  * No tourniquets in log *  DICTATION: .Other Dictation: Dictation Number [id number]  PLAN OF CARE: Discharge to home after PACU  PATIENT DISPOSITION:  PACU - hemodynamically stable.   Delay start of Pharmacological VTE agent (>24hrs) due to surgical blood loss or risk of bleeding: not applicable

## 2022-04-30 NOTE — Progress Notes (Signed)
Radiation Oncology         (336) [phone] ________________________________  Name: Blake Wolfe MRN: [id number]  Date: 04/30/2022  DOB: 1961/02/24       Prostate Seed Implant  BS:JGGEZM, Blake Wolfe  No ref. provider found  DIAGNOSIS:  61 y.o. gentleman with Stage T1c adenocarcinoma of the prostate with Gleason score of 4+5, and PSA of 34.7.  Oncology History  Malignant neoplasm of prostate (Blake Wolfe)  11/10/2021 Cancer Staging   Staging form: Prostate, AJCC 8th Edition - Clinical stage from 11/10/2021: Stage IIIC (cT1c, cN0, cM0, PSA: 34.7, Grade Group: 5) - Signed by Blake Caldron, PA-C on 12/18/2021 Histopathologic type: Adenocarcinoma, NOS Stage prefix: Initial diagnosis Prostate specific antigen (PSA) range: 20 or greater Gleason primary pattern: 4 Gleason secondary pattern: 5 Gleason score: 9 Histologic grading system: 5 grade system Number of biopsy cores examined: 12 Number of biopsy cores positive: 10 Location of positive needle core biopsies: Both sides   12/18/2021 Initial Diagnosis   Malignant neoplasm of prostate (Blake Wolfe)       ICD-10-CM   1. Pre-op testing  Z01.818 CBG per Guidelines for Diabetes Management for Patients Undergoing Surgery ([facility], [facility], and [facility] only)    CBG per protocol    CBG per Guidelines for Diabetes Management for Patients Undergoing Surgery ([facility], [facility], and [facility] only)    CBG per protocol    2. Primary hypertension  I10 I-Stat, Chem 8 on day of surgery per protocol    I-Stat, Chem 8 on day of surgery per protocol    CANCELED: EKG 12 lead per protocol      PROCEDURE: Insertion of radioactive I-125 seeds into the prostate gland.  RADIATION DOSE: 110 Gy, boost therapy.  TECHNIQUE: Blake Wolfe was brought to the operating room with the urologist. He was placed in the dorsolithotomy position. He was catheterized and a rectal tube was inserted. The perineum was shaved, prepped and draped. The ultrasound probe was then introduced into the rectum to see  the prostate gland.  TREATMENT DEVICE: A needle grid was attached to the ultrasound probe stand and anchor needles were placed.  3D PLANNING: The prostate was imaged in 3D using a sagittal sweep of the prostate probe. These images were transferred to the planning computer. There, the prostate, urethra and rectum were defined on each axial reconstructed image. Then, the software created an optimized 3D plan and a few seed positions were adjusted. The quality of the plan was reviewed using Blake Wolfe information for the target and the following two organs at risk:  Urethra and Rectum.  Then the accepted plan was printed and handed off to the radiation therapist.  Under my supervision, the custom loading of the seeds and spacers was carried out and loaded into sealed vicryl sleeves.  These pre-loaded needles were then placed into the needle holder.Marland Kitchen  PROSTATE VOLUME STUDY:  Using transrectal ultrasound the volume of the prostate was verified to be 19.1 cc.  SPECIAL TREATMENT PROCEDURE/SUPERVISION AND HANDLING: The pre-loaded needles were then delivered under sagittal guidance. A total of 16 needles were used to deposit 55 seeds in the prostate gland. The individual seed activity was 0.242 mCi.  SpaceOAR:  Yes  COMPLEX SIMULATION: At the end of the procedure, an anterior radiograph of the pelvis was obtained to document seed positioning and count. Cystoscopy was performed to check the urethra and bladder.  MICRODOSIMETRY: At the end of the procedure, the patient was emitting 0.03 mR/hr at 1 meter. Accordingly, he was considered  safe for hospital discharge.  PLAN: The patient will return to the radiation oncology clinic for post implant CT dosimetry in three weeks.   ________________________________  Blake Wolfe, M.D.

## 2022-05-04 ENCOUNTER — Encounter (HOSPITAL_BASED_OUTPATIENT_CLINIC_OR_DEPARTMENT_OTHER): Payer: Self-pay | Admitting: Urology

## 2022-05-04 NOTE — Anesthesia Postprocedure Evaluation (Signed)
Anesthesia Post Note  Patient: Blake Wolfe  Procedure(s) Performed: RADIOACTIVE SEED IMPLANT/BRACHYTHERAPY IMPLANT (Prostate) SPACE OAR INSTILLATION (Perineum)     Patient location during evaluation: PACU Anesthesia Type: General Level of consciousness: awake and alert Pain management: pain level controlled Vital Signs Assessment: post-procedure vital signs reviewed and stable Respiratory status: spontaneous breathing, nonlabored ventilation, respiratory function stable and patient connected to nasal cannula oxygen Cardiovascular status: blood pressure returned to baseline and stable Postop Assessment: no apparent nausea or vomiting Anesthetic complications: no   No notable events documented.  Last Vitals:  Vitals:   04/30/22 1445 04/30/22 1508  BP: 131/83 (!) 142/88  Pulse: 68 70  Resp: 12 13  Temp:  (!) 36.2 C  SpO2: 98% 97%    Last Pain:  Vitals:   05/04/22 1330  TempSrc:   PainSc: 0-No pain                 Barnet Glasgow

## 2022-05-12 ENCOUNTER — Telehealth: Payer: Self-pay | Admitting: *Deleted

## 2022-05-12 NOTE — Telephone Encounter (Signed)
CALLED PATIENT TO REMIND OF SIM APPT. FOR 05-13-22- ARRIVAL TIME- 1:45 PM @ [place], SPOKE WITH PATIENT AND HE IS AWARE OF THIS APPT

## 2022-05-13 ENCOUNTER — Ambulatory Visit
Admission: RE | Admit: 2022-05-13 | Discharge: 2022-05-13 | Disposition: A | Discharge status: Home / Self Care | Payer: No Typology Code available for payment source | Source: Ambulatory Visit | Attending: Radiation Oncology | Admitting: Radiation Oncology

## 2022-05-13 ENCOUNTER — Other Ambulatory Visit: Payer: Self-pay

## 2022-05-13 ENCOUNTER — Ambulatory Visit: Payer: No Typology Code available for payment source | Admitting: Radiation Oncology

## 2022-05-13 DIAGNOSIS — Z510 Encounter for antineoplastic radiation therapy: Secondary | ICD-10-CM | POA: Insufficient documentation

## 2022-05-13 DIAGNOSIS — C61 Malignant neoplasm of prostate: Secondary | ICD-10-CM | POA: Diagnosis present

## 2022-05-13 NOTE — Progress Notes (Signed)
  Radiation Oncology         [phone]) [phone] ________________________________  Name: Blake Wolfe MRN: [id number]  Date: 05/13/2022  DOB: [birth year]  COMPLEX SIMULATION NOTE  NARRATIVE:  The patient was brought to the [place] today following prostate seed implantation approximately one month ago.  Identity was confirmed.  All relevant records and images related to the planned course of therapy were reviewed.  Then, the patient was set-up supine.  CT images were obtained.  The CT images were loaded into the planning software.  Then the prostate and rectum were contoured.  Treatment planning then occurred.  The implanted iodine 125 seeds were identified by the physics staff for projection of radiation distribution  I have requested : 3D Simulation  I have requested a DVH of the following structures: Prostate and rectum.    ________________________________  Sheral Apley Tammi Klippel, M.D.

## 2022-05-13 NOTE — Progress Notes (Signed)
  Radiation Oncology         (336) [phone] ________________________________  Name: Blake Wolfe MRN: [id number]  Date: 05/13/2022  DOB: 09/19/1960  SIMULATION AND TREATMENT PLANNING NOTE    ICD-10-CM   1. Malignant neoplasm of prostate ([place])  C61       DIAGNOSIS:   61 y.o. gentleman with Stage T1c adenocarcinoma of the prostate with Gleason score of 4+5, and PSA of 34.7.  NARRATIVE:  The patient was brought to the [place].  Identity was confirmed.  All relevant records and images related to the planned course of therapy were reviewed.  The patient freely provided informed written consent to proceed with treatment after reviewing the details related to the planned course of therapy. The consent form was witnessed and verified by the simulation staff.  Then, the patient was set-up in a stable reproducible supine position for radiation therapy.  A vacuum lock pillow device was custom fabricated to position his legs in a reproducible immobilized position.  Then, I performed a urethrogram under sterile conditions to identify the prostatic apex.  CT images were obtained.  Surface markings were placed.  The CT images were loaded into the planning software.  Then the prostate target and avoidance structures including the rectum, bladder, bowel and hips were contoured.  Treatment planning then occurred.  The radiation prescription was entered and confirmed.  A total of one complex treatment devices were fabricated. I have requested : Intensity Modulated Radiotherapy (IMRT) is medically necessary for this case for the following reason:  Rectal sparing.Marland Kitchen  PLAN:  The patient will receive 45 Gy in 25 fractions of 1.8 Gy, to supplement an up-front prostate seed implant boost of 110 Gy to achieve a total nominal dose of 155 Gy.  ________________________________  Sheral Apley Tammi Klippel, M.D.

## 2022-05-19 DIAGNOSIS — Z510 Encounter for antineoplastic radiation therapy: Secondary | ICD-10-CM | POA: Diagnosis not present

## 2022-05-24 ENCOUNTER — Ambulatory Visit: Payer: No Typology Code available for payment source | Admitting: Radiation Oncology

## 2022-05-25 ENCOUNTER — Ambulatory Visit: Payer: No Typology Code available for payment source

## 2022-05-25 ENCOUNTER — Encounter: Payer: Self-pay | Admitting: Radiation Oncology

## 2022-05-25 DIAGNOSIS — Z510 Encounter for antineoplastic radiation therapy: Secondary | ICD-10-CM | POA: Diagnosis not present

## 2022-05-25 NOTE — Progress Notes (Signed)
  Radiation Oncology         (336) [phone] ________________________________  Name: Blake Wolfe MRN: [id number]  Date: 05/25/2022  DOB: Sep 24, 1960  3D Planning Note   Prostate Brachytherapy Post-Implant Dosimetry  Diagnosis: 61 y.o. gentleman with Stage T1c adenocarcinoma of the prostate with Gleason score of 4+5, and PSA of 34.7  Narrative: On a previous date, Blake Wolfe returned following prostate seed implantation for post implant planning. He underwent CT scan complex simulation to delineate the three-dimensional structures of the pelvis and demonstrate the radiation distribution.  Since that time, the seed localization, and complex isodose planning with dose volume histograms have now been completed.  Results:   Prostate Coverage - The dose of radiation delivered to the 90% or more of the prostate gland (D90) was 108.15% of the prescription dose. This exceeds our goal of greater than 90%. Rectal Sparing - The volume of rectal tissue receiving the prescription dose or higher was 0.0 cc. This falls under our thresholds tolerance of 1.0 cc.  Impression: The prostate seed implant appears to show adequate target coverage and appropriate rectal sparing.  Plan:  The patient will continue to follow with urology for ongoing PSA determinations. I would anticipate a high likelihood for local tumor control with minimal risk for rectal morbidity.  ________________________________  Sheral Apley Tammi Klippel, M.D.

## 2022-05-26 ENCOUNTER — Ambulatory Visit: Payer: No Typology Code available for payment source

## 2022-05-27 ENCOUNTER — Other Ambulatory Visit: Payer: Self-pay

## 2022-05-27 ENCOUNTER — Ambulatory Visit
Admission: RE | Admit: 2022-05-27 | Discharge: 2022-05-27 | Disposition: A | Discharge status: Home / Self Care | Payer: No Typology Code available for payment source | Source: Ambulatory Visit | Attending: Radiation Oncology | Admitting: Radiation Oncology

## 2022-05-27 ENCOUNTER — Ambulatory Visit: Payer: No Typology Code available for payment source

## 2022-05-27 DIAGNOSIS — C61 Malignant neoplasm of prostate: Secondary | ICD-10-CM

## 2022-05-27 DIAGNOSIS — Z510 Encounter for antineoplastic radiation therapy: Secondary | ICD-10-CM | POA: Diagnosis not present

## 2022-05-27 LAB — RAD ONC ARIA SESSION SUMMARY
Course Elapsed Days: 0
Plan Fractions Treated to Date: 1
Plan Prescribed Dose Per Fraction: 1.8 Gy
Plan Total Fractions Prescribed: 25
Plan Total Prescribed Dose: 45 Gy
Reference Point Dosage Given to Date: 1.8 Gy
Reference Point Session Dosage Given: 1.8 Gy
Session Number: 1

## 2022-05-28 ENCOUNTER — Ambulatory Visit: Payer: No Typology Code available for payment source

## 2022-05-31 ENCOUNTER — Ambulatory Visit: Admission: RE | Admit: 2022-05-31 | Payer: No Typology Code available for payment source | Source: Ambulatory Visit

## 2022-05-31 ENCOUNTER — Other Ambulatory Visit: Payer: Self-pay

## 2022-06-01 ENCOUNTER — Ambulatory Visit
Admission: RE | Admit: 2022-06-01 | Discharge: 2022-06-01 | Disposition: A | Discharge status: Home / Self Care | Payer: No Typology Code available for payment source | Source: Ambulatory Visit | Attending: Radiation Oncology | Admitting: Radiation Oncology

## 2022-06-01 ENCOUNTER — Other Ambulatory Visit: Payer: Self-pay

## 2022-06-01 DIAGNOSIS — C61 Malignant neoplasm of prostate: Secondary | ICD-10-CM | POA: Insufficient documentation

## 2022-06-01 DIAGNOSIS — Z510 Encounter for antineoplastic radiation therapy: Secondary | ICD-10-CM | POA: Insufficient documentation

## 2022-06-01 LAB — RAD ONC ARIA SESSION SUMMARY
Course Elapsed Days: 5
Plan Fractions Treated to Date: 2
Plan Prescribed Dose Per Fraction: 1.8 Gy
Plan Total Fractions Prescribed: 25
Plan Total Prescribed Dose: 45 Gy
Reference Point Dosage Given to Date: 3.6 Gy
Reference Point Session Dosage Given: 1.8 Gy
Session Number: 2

## 2022-06-02 ENCOUNTER — Other Ambulatory Visit: Payer: Self-pay

## 2022-06-02 ENCOUNTER — Ambulatory Visit
Admission: RE | Admit: 2022-06-02 | Discharge: 2022-06-02 | Disposition: A | Discharge status: Home / Self Care | Payer: No Typology Code available for payment source | Source: Ambulatory Visit | Attending: Radiation Oncology | Admitting: Radiation Oncology

## 2022-06-02 DIAGNOSIS — Z510 Encounter for antineoplastic radiation therapy: Secondary | ICD-10-CM | POA: Diagnosis not present

## 2022-06-02 LAB — RAD ONC ARIA SESSION SUMMARY
Course Elapsed Days: 6
Plan Fractions Treated to Date: 3
Plan Prescribed Dose Per Fraction: 1.8 Gy
Plan Total Fractions Prescribed: 25
Plan Total Prescribed Dose: 45 Gy
Reference Point Dosage Given to Date: 5.4 Gy
Reference Point Session Dosage Given: 1.8 Gy
Session Number: 3

## 2022-06-03 ENCOUNTER — Other Ambulatory Visit: Payer: Self-pay

## 2022-06-03 ENCOUNTER — Ambulatory Visit
Admission: RE | Admit: 2022-06-03 | Discharge: 2022-06-03 | Disposition: A | Discharge status: Home / Self Care | Payer: No Typology Code available for payment source | Source: Ambulatory Visit | Attending: Radiation Oncology | Admitting: Radiation Oncology

## 2022-06-03 DIAGNOSIS — Z510 Encounter for antineoplastic radiation therapy: Secondary | ICD-10-CM | POA: Diagnosis not present

## 2022-06-03 LAB — RAD ONC ARIA SESSION SUMMARY
Course Elapsed Days: 7
Plan Fractions Treated to Date: 4
Plan Prescribed Dose Per Fraction: 1.8 Gy
Plan Total Fractions Prescribed: 25
Plan Total Prescribed Dose: 45 Gy
Reference Point Dosage Given to Date: 7.2 Gy
Reference Point Session Dosage Given: 1.8 Gy
Session Number: 4

## 2022-06-04 ENCOUNTER — Ambulatory Visit
Admission: RE | Admit: 2022-06-04 | Discharge: 2022-06-04 | Disposition: A | Discharge status: Home / Self Care | Payer: No Typology Code available for payment source | Source: Ambulatory Visit | Attending: Radiation Oncology | Admitting: Radiation Oncology

## 2022-06-04 ENCOUNTER — Other Ambulatory Visit: Payer: Self-pay

## 2022-06-04 DIAGNOSIS — Z510 Encounter for antineoplastic radiation therapy: Secondary | ICD-10-CM | POA: Diagnosis not present

## 2022-06-04 LAB — RAD ONC ARIA SESSION SUMMARY
Course Elapsed Days: 8
Plan Fractions Treated to Date: 5
Plan Prescribed Dose Per Fraction: 1.8 Gy
Plan Total Fractions Prescribed: 25
Plan Total Prescribed Dose: 45 Gy
Reference Point Dosage Given to Date: 9 Gy
Reference Point Session Dosage Given: 1.8 Gy
Session Number: 5

## 2022-06-07 ENCOUNTER — Other Ambulatory Visit: Payer: Self-pay

## 2022-06-07 ENCOUNTER — Ambulatory Visit
Admission: RE | Admit: 2022-06-07 | Discharge: 2022-06-07 | Disposition: A | Discharge status: Home / Self Care | Payer: No Typology Code available for payment source | Source: Ambulatory Visit | Attending: Radiation Oncology | Admitting: Radiation Oncology

## 2022-06-07 DIAGNOSIS — Z510 Encounter for antineoplastic radiation therapy: Secondary | ICD-10-CM | POA: Diagnosis not present

## 2022-06-07 LAB — RAD ONC ARIA SESSION SUMMARY
Course Elapsed Days: 11
Plan Fractions Treated to Date: 6
Plan Prescribed Dose Per Fraction: 1.8 Gy
Plan Total Fractions Prescribed: 25
Plan Total Prescribed Dose: 45 Gy
Reference Point Dosage Given to Date: 10.8 Gy
Reference Point Session Dosage Given: 1.8 Gy
Session Number: 6

## 2022-06-08 ENCOUNTER — Other Ambulatory Visit: Payer: Self-pay

## 2022-06-08 ENCOUNTER — Ambulatory Visit
Admission: RE | Admit: 2022-06-08 | Discharge: 2022-06-08 | Disposition: A | Discharge status: Home / Self Care | Payer: No Typology Code available for payment source | Source: Ambulatory Visit | Attending: Radiation Oncology | Admitting: Radiation Oncology

## 2022-06-08 DIAGNOSIS — Z510 Encounter for antineoplastic radiation therapy: Secondary | ICD-10-CM | POA: Diagnosis not present

## 2022-06-08 LAB — RAD ONC ARIA SESSION SUMMARY
Course Elapsed Days: 12
Plan Fractions Treated to Date: 7
Plan Prescribed Dose Per Fraction: 1.8 Gy
Plan Total Fractions Prescribed: 25
Plan Total Prescribed Dose: 45 Gy
Reference Point Dosage Given to Date: 12.6 Gy
Reference Point Session Dosage Given: 1.8 Gy
Session Number: 7

## 2022-06-09 ENCOUNTER — Other Ambulatory Visit: Payer: Self-pay

## 2022-06-09 ENCOUNTER — Ambulatory Visit
Admission: RE | Admit: 2022-06-09 | Discharge: 2022-06-09 | Disposition: A | Discharge status: Home / Self Care | Payer: No Typology Code available for payment source | Source: Ambulatory Visit | Attending: Radiation Oncology | Admitting: Radiation Oncology

## 2022-06-09 DIAGNOSIS — Z510 Encounter for antineoplastic radiation therapy: Secondary | ICD-10-CM | POA: Diagnosis not present

## 2022-06-09 LAB — RAD ONC ARIA SESSION SUMMARY
Course Elapsed Days: 13
Plan Fractions Treated to Date: 8
Plan Prescribed Dose Per Fraction: 1.8 Gy
Plan Total Fractions Prescribed: 25
Plan Total Prescribed Dose: 45 Gy
Reference Point Dosage Given to Date: 14.4 Gy
Reference Point Session Dosage Given: 1.8 Gy
Session Number: 8

## 2022-06-10 ENCOUNTER — Ambulatory Visit
Admission: RE | Admit: 2022-06-10 | Discharge: 2022-06-10 | Disposition: A | Discharge status: Home / Self Care | Payer: No Typology Code available for payment source | Source: Ambulatory Visit | Attending: Radiation Oncology | Admitting: Radiation Oncology

## 2022-06-10 ENCOUNTER — Other Ambulatory Visit: Payer: Self-pay

## 2022-06-10 DIAGNOSIS — Z510 Encounter for antineoplastic radiation therapy: Secondary | ICD-10-CM | POA: Diagnosis not present

## 2022-06-10 LAB — RAD ONC ARIA SESSION SUMMARY
Course Elapsed Days: 14
Plan Fractions Treated to Date: 9
Plan Prescribed Dose Per Fraction: 1.8 Gy
Plan Total Fractions Prescribed: 25
Plan Total Prescribed Dose: 45 Gy
Reference Point Dosage Given to Date: 16.2 Gy
Reference Point Session Dosage Given: 1.8 Gy
Session Number: 9

## 2022-06-11 ENCOUNTER — Other Ambulatory Visit: Payer: Self-pay

## 2022-06-11 ENCOUNTER — Ambulatory Visit
Admission: RE | Admit: 2022-06-11 | Discharge: 2022-06-11 | Disposition: A | Discharge status: Home / Self Care | Payer: No Typology Code available for payment source | Source: Ambulatory Visit | Attending: Radiation Oncology | Admitting: Radiation Oncology

## 2022-06-11 DIAGNOSIS — Z510 Encounter for antineoplastic radiation therapy: Secondary | ICD-10-CM | POA: Diagnosis not present

## 2022-06-11 LAB — RAD ONC ARIA SESSION SUMMARY
Course Elapsed Days: 15
Plan Fractions Treated to Date: 10
Plan Prescribed Dose Per Fraction: 1.8 Gy
Plan Total Fractions Prescribed: 25
Plan Total Prescribed Dose: 45 Gy
Reference Point Dosage Given to Date: 18 Gy
Reference Point Session Dosage Given: 1.8 Gy
Session Number: 10

## 2022-06-14 ENCOUNTER — Other Ambulatory Visit: Payer: Self-pay

## 2022-06-14 ENCOUNTER — Ambulatory Visit
Admission: RE | Admit: 2022-06-14 | Discharge: 2022-06-14 | Disposition: A | Discharge status: Home / Self Care | Payer: No Typology Code available for payment source | Source: Ambulatory Visit | Attending: Radiation Oncology | Admitting: Radiation Oncology

## 2022-06-14 DIAGNOSIS — Z510 Encounter for antineoplastic radiation therapy: Secondary | ICD-10-CM | POA: Diagnosis not present

## 2022-06-14 LAB — RAD ONC ARIA SESSION SUMMARY
Course Elapsed Days: 18
Plan Fractions Treated to Date: 11
Plan Prescribed Dose Per Fraction: 1.8 Gy
Plan Total Fractions Prescribed: 25
Plan Total Prescribed Dose: 45 Gy
Reference Point Dosage Given to Date: 19.8 Gy
Reference Point Session Dosage Given: 1.8 Gy
Session Number: 11

## 2022-06-15 ENCOUNTER — Ambulatory Visit
Admission: RE | Admit: 2022-06-15 | Discharge: 2022-06-15 | Disposition: A | Discharge status: Home / Self Care | Payer: No Typology Code available for payment source | Source: Ambulatory Visit | Attending: Radiation Oncology | Admitting: Radiation Oncology

## 2022-06-15 ENCOUNTER — Other Ambulatory Visit: Payer: Self-pay

## 2022-06-15 DIAGNOSIS — Z510 Encounter for antineoplastic radiation therapy: Secondary | ICD-10-CM | POA: Diagnosis not present

## 2022-06-15 LAB — RAD ONC ARIA SESSION SUMMARY
Course Elapsed Days: 19
Plan Fractions Treated to Date: 12
Plan Prescribed Dose Per Fraction: 1.8 Gy
Plan Total Fractions Prescribed: 25
Plan Total Prescribed Dose: 45 Gy
Reference Point Dosage Given to Date: 21.6 Gy
Reference Point Session Dosage Given: 1.8 Gy
Session Number: 12

## 2022-06-16 ENCOUNTER — Ambulatory Visit
Admission: RE | Admit: 2022-06-16 | Discharge: 2022-06-16 | Disposition: A | Discharge status: Home / Self Care | Payer: No Typology Code available for payment source | Source: Ambulatory Visit | Attending: Radiation Oncology | Admitting: Radiation Oncology

## 2022-06-16 ENCOUNTER — Other Ambulatory Visit: Payer: Self-pay

## 2022-06-16 DIAGNOSIS — Z510 Encounter for antineoplastic radiation therapy: Secondary | ICD-10-CM | POA: Diagnosis not present

## 2022-06-16 LAB — RAD ONC ARIA SESSION SUMMARY
Course Elapsed Days: 20
Plan Fractions Treated to Date: 13
Plan Prescribed Dose Per Fraction: 1.8 Gy
Plan Total Fractions Prescribed: 25
Plan Total Prescribed Dose: 45 Gy
Reference Point Dosage Given to Date: 23.4 Gy
Reference Point Session Dosage Given: 1.8 Gy
Session Number: 13

## 2022-06-17 ENCOUNTER — Ambulatory Visit
Admission: RE | Admit: 2022-06-17 | Discharge: 2022-06-17 | Disposition: A | Discharge status: Home / Self Care | Payer: No Typology Code available for payment source | Source: Ambulatory Visit | Attending: Radiation Oncology | Admitting: Radiation Oncology

## 2022-06-17 ENCOUNTER — Other Ambulatory Visit: Payer: Self-pay

## 2022-06-17 DIAGNOSIS — Z510 Encounter for antineoplastic radiation therapy: Secondary | ICD-10-CM | POA: Diagnosis not present

## 2022-06-17 LAB — RAD ONC ARIA SESSION SUMMARY
Course Elapsed Days: 21
Plan Fractions Treated to Date: 14
Plan Prescribed Dose Per Fraction: 1.8 Gy
Plan Total Fractions Prescribed: 25
Plan Total Prescribed Dose: 45 Gy
Reference Point Dosage Given to Date: 25.2 Gy
Reference Point Session Dosage Given: 1.8 Gy
Session Number: 14

## 2022-06-18 ENCOUNTER — Other Ambulatory Visit: Payer: Self-pay

## 2022-06-18 ENCOUNTER — Telehealth: Payer: Self-pay

## 2022-06-18 ENCOUNTER — Ambulatory Visit
Admission: RE | Admit: 2022-06-18 | Discharge: 2022-06-18 | Disposition: A | Discharge status: Home / Self Care | Payer: No Typology Code available for payment source | Source: Ambulatory Visit | Attending: Radiation Oncology | Admitting: Radiation Oncology

## 2022-06-18 DIAGNOSIS — Z510 Encounter for antineoplastic radiation therapy: Secondary | ICD-10-CM | POA: Diagnosis not present

## 2022-06-18 LAB — RAD ONC ARIA SESSION SUMMARY
Course Elapsed Days: 22
Plan Fractions Treated to Date: 15
Plan Prescribed Dose Per Fraction: 1.8 Gy
Plan Total Fractions Prescribed: 25
Plan Total Prescribed Dose: 45 Gy
Reference Point Dosage Given to Date: 27 Gy
Reference Point Session Dosage Given: 1.8 Gy
Session Number: 15

## 2022-06-21 ENCOUNTER — Other Ambulatory Visit: Payer: Self-pay

## 2022-06-21 ENCOUNTER — Ambulatory Visit
Admission: RE | Admit: 2022-06-21 | Discharge: 2022-06-21 | Disposition: A | Discharge status: Home / Self Care | Payer: No Typology Code available for payment source | Source: Ambulatory Visit | Attending: Radiation Oncology | Admitting: Radiation Oncology

## 2022-06-21 DIAGNOSIS — Z510 Encounter for antineoplastic radiation therapy: Secondary | ICD-10-CM | POA: Diagnosis not present

## 2022-06-21 LAB — RAD ONC ARIA SESSION SUMMARY
Course Elapsed Days: 25
Plan Fractions Treated to Date: 16
Plan Prescribed Dose Per Fraction: 1.8 Gy
Plan Total Fractions Prescribed: 25
Plan Total Prescribed Dose: 45 Gy
Reference Point Dosage Given to Date: 28.8 Gy
Reference Point Session Dosage Given: 1.8 Gy
Session Number: 16

## 2022-06-22 ENCOUNTER — Other Ambulatory Visit: Payer: Self-pay

## 2022-06-22 ENCOUNTER — Ambulatory Visit: Payer: No Typology Code available for payment source

## 2022-06-22 ENCOUNTER — Ambulatory Visit
Admission: RE | Admit: 2022-06-22 | Discharge: 2022-06-22 | Disposition: A | Discharge status: Home / Self Care | Payer: No Typology Code available for payment source | Source: Ambulatory Visit | Attending: Radiation Oncology | Admitting: Radiation Oncology

## 2022-06-22 DIAGNOSIS — Z510 Encounter for antineoplastic radiation therapy: Secondary | ICD-10-CM | POA: Diagnosis not present

## 2022-06-22 LAB — RAD ONC ARIA SESSION SUMMARY
Course Elapsed Days: 26
Plan Fractions Treated to Date: 17
Plan Prescribed Dose Per Fraction: 1.8 Gy
Plan Total Fractions Prescribed: 25
Plan Total Prescribed Dose: 45 Gy
Reference Point Dosage Given to Date: 30.6 Gy
Reference Point Session Dosage Given: 1.8 Gy
Session Number: 17

## 2022-06-23 ENCOUNTER — Other Ambulatory Visit: Payer: Self-pay

## 2022-06-23 ENCOUNTER — Ambulatory Visit: Payer: No Typology Code available for payment source

## 2022-06-23 ENCOUNTER — Ambulatory Visit
Admission: RE | Admit: 2022-06-23 | Discharge: 2022-06-23 | Disposition: A | Discharge status: Home / Self Care | Payer: No Typology Code available for payment source | Source: Ambulatory Visit | Attending: Radiation Oncology | Admitting: Radiation Oncology

## 2022-06-23 DIAGNOSIS — Z510 Encounter for antineoplastic radiation therapy: Secondary | ICD-10-CM | POA: Diagnosis not present

## 2022-06-23 LAB — RAD ONC ARIA SESSION SUMMARY
Course Elapsed Days: 27
Plan Fractions Treated to Date: 18
Plan Prescribed Dose Per Fraction: 1.8 Gy
Plan Total Fractions Prescribed: 25
Plan Total Prescribed Dose: 45 Gy
Reference Point Dosage Given to Date: 32.4 Gy
Reference Point Session Dosage Given: 1.8 Gy
Session Number: 18

## 2022-06-24 ENCOUNTER — Other Ambulatory Visit: Payer: Self-pay

## 2022-06-24 ENCOUNTER — Ambulatory Visit
Admission: RE | Admit: 2022-06-24 | Discharge: 2022-06-24 | Disposition: A | Discharge status: Home / Self Care | Payer: No Typology Code available for payment source | Source: Ambulatory Visit | Attending: Radiation Oncology | Admitting: Radiation Oncology

## 2022-06-24 DIAGNOSIS — Z510 Encounter for antineoplastic radiation therapy: Secondary | ICD-10-CM | POA: Diagnosis not present

## 2022-06-24 LAB — RAD ONC ARIA SESSION SUMMARY
Course Elapsed Days: 28
Plan Fractions Treated to Date: 19
Plan Prescribed Dose Per Fraction: 1.8 Gy
Plan Total Fractions Prescribed: 25
Plan Total Prescribed Dose: 45 Gy
Reference Point Dosage Given to Date: 34.2 Gy
Reference Point Session Dosage Given: 1.8 Gy
Session Number: 19

## 2022-06-25 ENCOUNTER — Other Ambulatory Visit: Payer: Self-pay

## 2022-06-25 ENCOUNTER — Ambulatory Visit
Admission: RE | Admit: 2022-06-25 | Discharge: 2022-06-25 | Disposition: A | Discharge status: Home / Self Care | Payer: No Typology Code available for payment source | Source: Ambulatory Visit | Attending: Radiation Oncology | Admitting: Radiation Oncology

## 2022-06-25 ENCOUNTER — Ambulatory Visit: Payer: No Typology Code available for payment source

## 2022-06-25 DIAGNOSIS — Z510 Encounter for antineoplastic radiation therapy: Secondary | ICD-10-CM | POA: Diagnosis not present

## 2022-06-25 LAB — RAD ONC ARIA SESSION SUMMARY
Course Elapsed Days: 29
Plan Fractions Treated to Date: 20
Plan Prescribed Dose Per Fraction: 1.8 Gy
Plan Total Fractions Prescribed: 25
Plan Total Prescribed Dose: 45 Gy
Reference Point Dosage Given to Date: 36 Gy
Reference Point Session Dosage Given: 1.8 Gy
Session Number: 20

## 2022-06-28 ENCOUNTER — Other Ambulatory Visit: Payer: Self-pay

## 2022-06-28 ENCOUNTER — Ambulatory Visit
Admission: RE | Admit: 2022-06-28 | Discharge: 2022-06-28 | Disposition: A | Discharge status: Home / Self Care | Payer: No Typology Code available for payment source | Source: Ambulatory Visit | Attending: Radiation Oncology | Admitting: Radiation Oncology

## 2022-06-28 DIAGNOSIS — Z510 Encounter for antineoplastic radiation therapy: Secondary | ICD-10-CM | POA: Diagnosis not present

## 2022-06-28 LAB — RAD ONC ARIA SESSION SUMMARY
Course Elapsed Days: 32
Plan Fractions Treated to Date: 21
Plan Prescribed Dose Per Fraction: 1.8 Gy
Plan Total Fractions Prescribed: 25
Plan Total Prescribed Dose: 45 Gy
Reference Point Dosage Given to Date: 37.8 Gy
Reference Point Session Dosage Given: 1.8 Gy
Session Number: 21

## 2022-06-29 ENCOUNTER — Other Ambulatory Visit: Payer: Self-pay

## 2022-06-29 ENCOUNTER — Ambulatory Visit
Admission: RE | Admit: 2022-06-29 | Discharge: 2022-06-29 | Disposition: A | Discharge status: Home / Self Care | Payer: No Typology Code available for payment source | Source: Ambulatory Visit | Attending: Radiation Oncology | Admitting: Radiation Oncology

## 2022-06-29 DIAGNOSIS — Z510 Encounter for antineoplastic radiation therapy: Secondary | ICD-10-CM | POA: Diagnosis not present

## 2022-06-29 LAB — RAD ONC ARIA SESSION SUMMARY
Course Elapsed Days: 33
Plan Fractions Treated to Date: 22
Plan Prescribed Dose Per Fraction: 1.8 Gy
Plan Total Fractions Prescribed: 25
Plan Total Prescribed Dose: 45 Gy
Reference Point Dosage Given to Date: 39.6 Gy
Reference Point Session Dosage Given: 1.8 Gy
Session Number: 22

## 2022-06-30 ENCOUNTER — Other Ambulatory Visit: Payer: Self-pay

## 2022-06-30 ENCOUNTER — Ambulatory Visit
Admission: RE | Admit: 2022-06-30 | Discharge: 2022-06-30 | Disposition: A | Discharge status: Home / Self Care | Payer: No Typology Code available for payment source | Source: Ambulatory Visit | Attending: Radiation Oncology | Admitting: Radiation Oncology

## 2022-06-30 ENCOUNTER — Ambulatory Visit: Payer: No Typology Code available for payment source

## 2022-06-30 DIAGNOSIS — C61 Malignant neoplasm of prostate: Secondary | ICD-10-CM | POA: Insufficient documentation

## 2022-06-30 DIAGNOSIS — Z510 Encounter for antineoplastic radiation therapy: Secondary | ICD-10-CM | POA: Insufficient documentation

## 2022-06-30 LAB — RAD ONC ARIA SESSION SUMMARY
Course Elapsed Days: 34
Plan Fractions Treated to Date: 23
Plan Prescribed Dose Per Fraction: 1.8 Gy
Plan Total Fractions Prescribed: 25
Plan Total Prescribed Dose: 45 Gy
Reference Point Dosage Given to Date: 41.4 Gy
Reference Point Session Dosage Given: 1.8 Gy
Session Number: 23

## 2022-07-01 ENCOUNTER — Other Ambulatory Visit: Payer: Self-pay

## 2022-07-01 ENCOUNTER — Ambulatory Visit: Payer: No Typology Code available for payment source

## 2022-07-01 ENCOUNTER — Ambulatory Visit
Admission: RE | Admit: 2022-07-01 | Discharge: 2022-07-01 | Disposition: A | Discharge status: Home / Self Care | Payer: No Typology Code available for payment source | Source: Ambulatory Visit | Attending: Radiation Oncology | Admitting: Radiation Oncology

## 2022-07-01 DIAGNOSIS — Z510 Encounter for antineoplastic radiation therapy: Secondary | ICD-10-CM | POA: Diagnosis not present

## 2022-07-01 LAB — RAD ONC ARIA SESSION SUMMARY
Course Elapsed Days: 35
Plan Fractions Treated to Date: 24
Plan Prescribed Dose Per Fraction: 1.8 Gy
Plan Total Fractions Prescribed: 25
Plan Total Prescribed Dose: 45 Gy
Reference Point Dosage Given to Date: 43.2 Gy
Reference Point Session Dosage Given: 1.8 Gy
Session Number: 24

## 2022-07-02 ENCOUNTER — Other Ambulatory Visit: Payer: Self-pay

## 2022-07-02 ENCOUNTER — Encounter: Payer: Self-pay | Admitting: Urology

## 2022-07-02 ENCOUNTER — Ambulatory Visit
Admission: RE | Admit: 2022-07-02 | Discharge: 2022-07-02 | Disposition: A | Discharge status: Home / Self Care | Payer: No Typology Code available for payment source | Source: Ambulatory Visit | Attending: Radiation Oncology | Admitting: Radiation Oncology

## 2022-07-02 DIAGNOSIS — Z510 Encounter for antineoplastic radiation therapy: Secondary | ICD-10-CM | POA: Diagnosis not present

## 2022-07-02 LAB — RAD ONC ARIA SESSION SUMMARY
Course Elapsed Days: 36
Plan Fractions Treated to Date: 25
Plan Prescribed Dose Per Fraction: 1.8 Gy
Plan Total Fractions Prescribed: 25
Plan Total Prescribed Dose: 45 Gy
Reference Point Dosage Given to Date: 45 Gy
Reference Point Session Dosage Given: 1.8 Gy
Session Number: 25

## 2022-08-03 ENCOUNTER — Ambulatory Visit
Admission: RE | Admit: 2022-08-03 | Discharge: 2022-08-03 | Disposition: A | Discharge status: Home / Self Care | Payer: No Typology Code available for payment source | Source: Ambulatory Visit | Attending: Radiation Oncology | Admitting: Radiation Oncology

## 2022-08-03 NOTE — Progress Notes (Signed)
  Radiation Oncology         (336) [phone] ________________________________  Name: Blake Wolfe MRN: [id number]  Date of Service: 08/03/2022  DOB: 1961-01-12  Post Treatment Telephone Note  Diagnosis:   61 y.o. gentleman with Stage T1c adenocarcinoma of the prostate with Gleason score of 4+5, and PSA of 34.7. (as documented in provider EOT note)   Pre Treatment IPSS Score: 12 (as documented in the provider consult note)   The patient was available for call today.   Symptoms of fatigue have improved since completing therapy.  Symptoms of bladder changes have  improved since completing therapy. Current symptoms include Nocturia X5, and medications for bladder symptoms include Tamsulosin.  Symptoms of bowel changes have  improved since completing therapy. Current symptoms include none, and medications for bowel symptoms include none.     Post Treatment IPSS Score: IPSS Questionnaire (AUA-7): Over the past month.   1)  How often have you had a sensation of not emptying your bladder completely after you finish urinating?  4 - More than half the time  2)  How often have you had to urinate again less than two hours after you finished urinating? 4 - More than half the time  3)  How often have you found you stopped and started again several times when you urinated?  1 - Less than 1 time in 5  4) How difficult have you found it to postpone urination?  0 - Not at all  5) How often have you had a weak urinary stream?  2 - Less than half the time  6) How often have you had to push or strain to begin urination?  3 - About half the time  7) How many times did you most typically get up to urinate from the time you went to bed until the time you got up in the morning?  5 - 5+ times  Total score:  19. Which indicates moderate symptoms  0-7 mildly symptomatic   8-19 moderately symptomatic   20-35 severely symptomatic    Patient has a scheduled follow up visit with his urologist, Dr. Tresa Moore,  on 08/05/2022 for ongoing surveillance. He was counseled that PSA levels will be drawn in the urology office, and was reassured that additional time is expected to improve bowel and bladder symptoms. He was encouraged to call back with concerns or questions regarding radiation.  This concludes the interview.   Leandra Kern, LPN

## 2022-08-06 NOTE — Progress Notes (Signed)
                                                                                                                                                             Patient Name: Blake Wolfe MRN: [id number] DOB: 01/18/1961 Referring Physician: [employer] Date of Service: 07/02/2022 [facility],                                                         End Of Treatment Note  Diagnoses: C61-Malignant neoplasm of prostate  Cancer Staging: 61 y.o. gentleman with Stage T1c adenocarcinoma of the prostate with Gleason score of 4+5, and PSA of 34.7.  Intent: Curative  Radiation Treatment Dates:  05/27/2022 through 07/02/2022 , to supplement an up-front prostate seed implant boost of 110 Gy to achieve a total nominal dose of 155 Gy.  Site Technique Total Dose (Gy) Dose per Fx (Gy) Completed Fx Beam Energies  Prostate: Prostate_Pelvis IMRT 45/45 1.8 25/25 6X   04/30/22:   Insertion of radioactive I-125 seeds into the prostate gland;  110 Gy, boost therapy.    Narrative: The patient tolerated radiation therapy relatively well with only minor urinary issues and modest fatigue.  He did report a weaker flow of stream despite taking Flomax daily as prescribed and also experienced some diarrhea.  Plan: The patient will receive a call in about one month from the radiation oncology department. He will continue follow up with Dr. Tresa Moore as well.    Nicholos Johns, PA-C    Tyler Pita, MD  [facility] Direct Dial: [phone]  Fax: [phone] [email or web address]  Skype  LinkedIn

## 2022-09-17 IMAGING — PT NM PET TUM IMG SKULL BASE T - THIGH
7 series · 25 of 25 positions shown · non-contrast
Comparison: Outside CT imaging from Thursday October, 2021 of the abdomen
and pelvis.

CLINICAL DATA: High-risk prostate cancer, high-volume Gleason 9
disease on biopsy with PSA of

EXAM:
NUCLEAR MEDICINE PET SKULL BASE TO THIGH
TECHNIQUE: 8.34 mCi F18 Piflufolastat (Pylarify) was injected intravenously.
Full-ring PET imaging was performed from the skull base to thigh
after the radiotracer. CT data was obtained and used for attenuation
correction and anatomic localization.

[Series 3: pet sk_thigh ac · axial · 5.0mm · 4.07mm/px · z∈[-881,+135]mm · 5 of 255 slices shown]
[im 1/255]
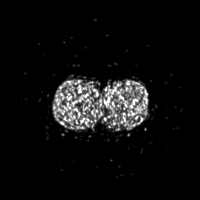
[im 64/255]
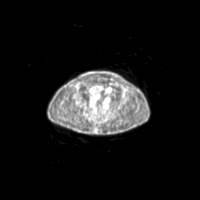
[im 128/255]
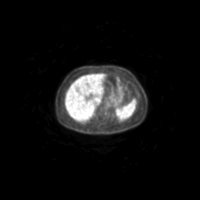
[im 191/255]
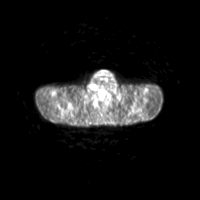
[im 255/255]
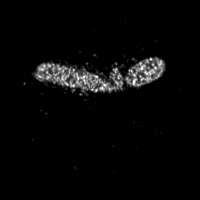

[Series 4: ct sk_thigh 5.0 br38 · axial · 5.0mm · 0.98mm/px · z∈[-881,+135]mm · 6 of 255 slices shown]
[im 1/255]
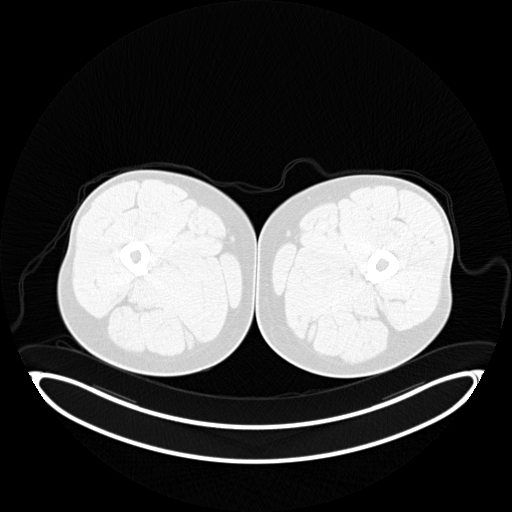
[im 51/255]
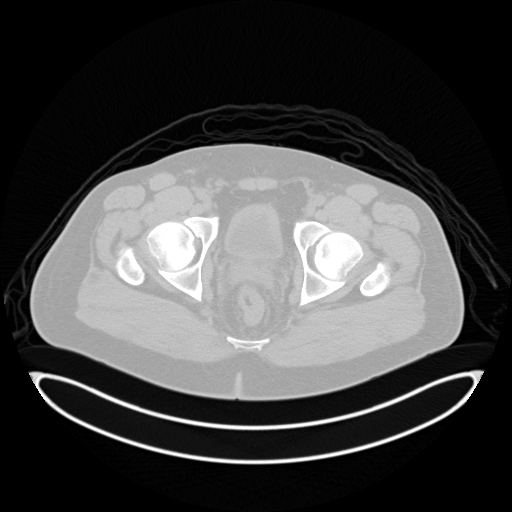
[im 102/255]
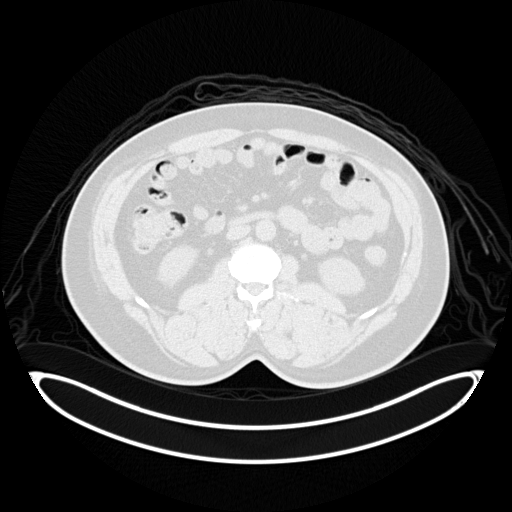
[im 153/255]
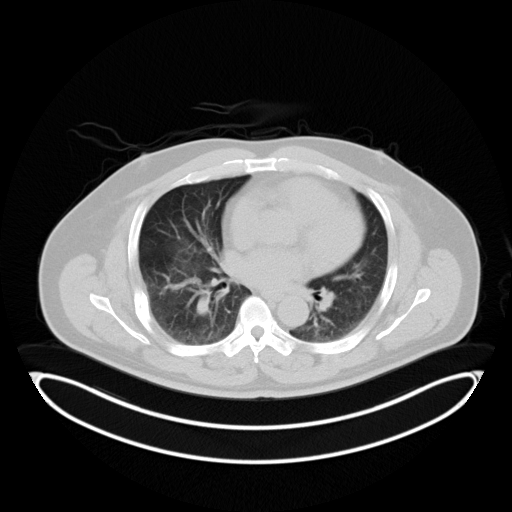
[im 204/255  brain]
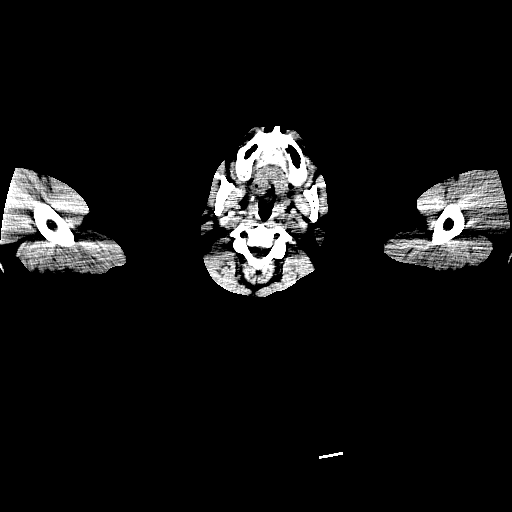
[im 255/255]
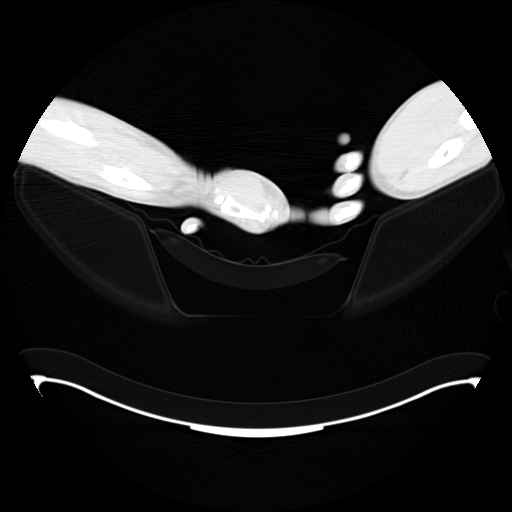

[Series 5: pet sk_thigh nac · axial · 5.0mm · 4.07mm/px · z∈[-881,+135]mm · 6 of 255 slices shown]
[im 1/255]
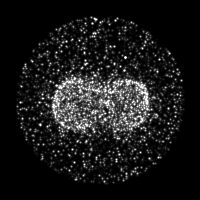
[im 51/255]
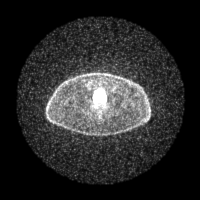
[im 102/255]
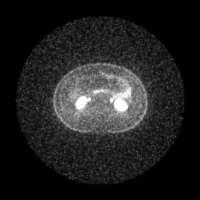
[im 153/255]
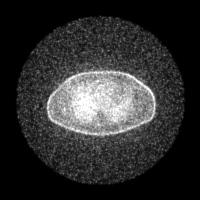
[im 204/255]
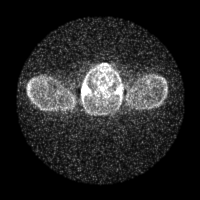
[im 255/255]
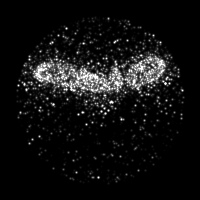

[Series 7: ct 5.0 bl57 lung_bone · axial · 5.0mm · 0.66mm/px · 1 of 58 slices shown]
[im 1/58]
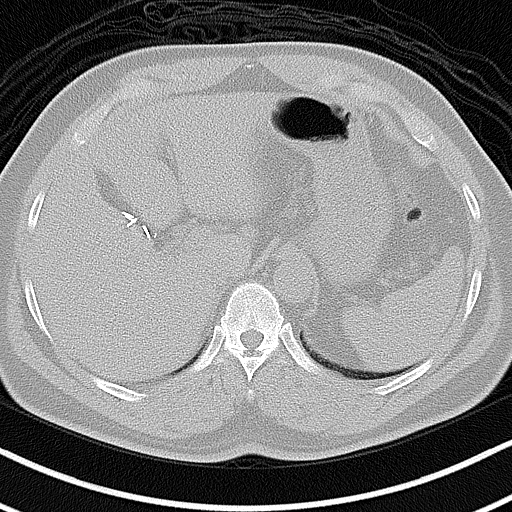

[Series 607: fused tra · 5 of 220 slices shown]
[im 1/220]
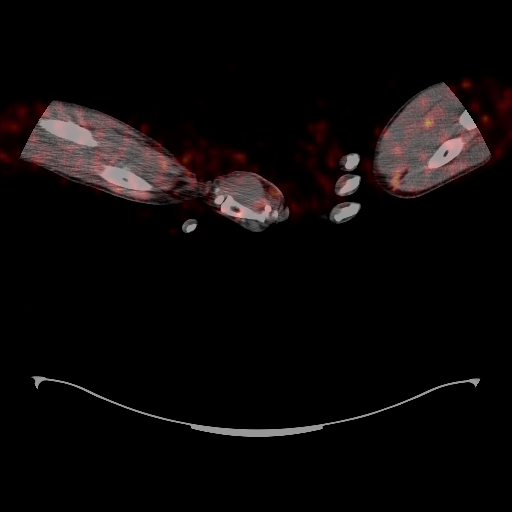
[im 55/220]
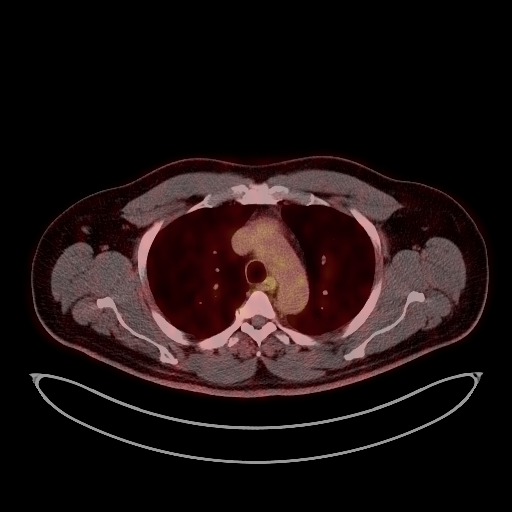
[im 110/220]
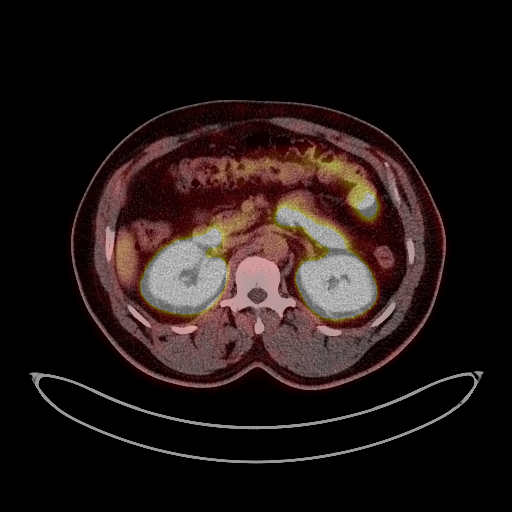
[im 165/220]
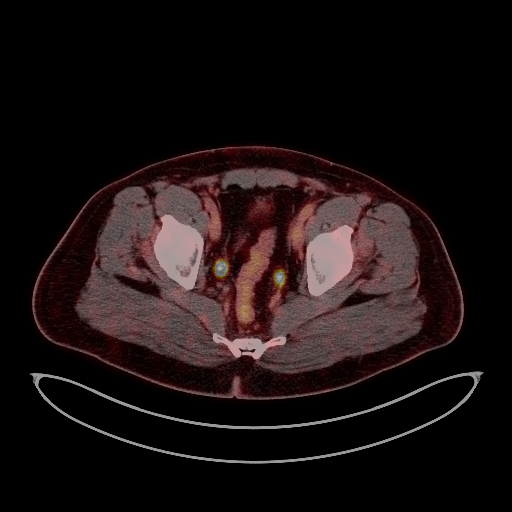
[im 220/220]
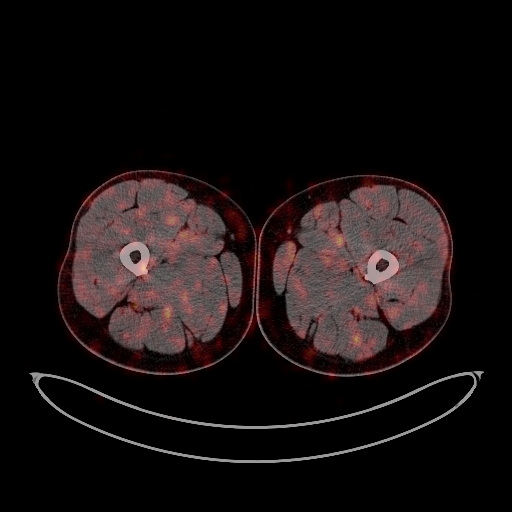

[Series 608: fused cor · 1 of 57 slices shown]
[im 1/57]
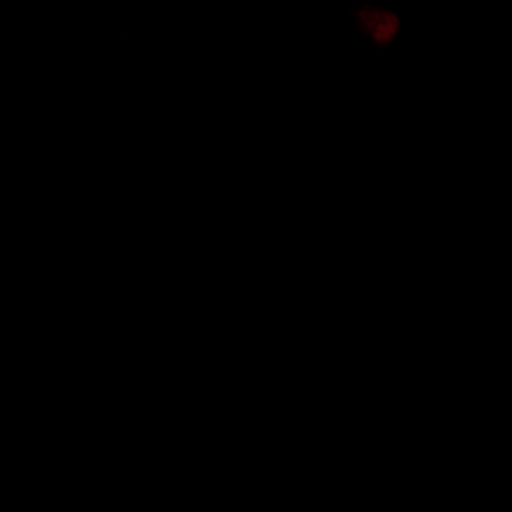

[Series 609: mip pet · coronal · 2.11mm/px · 1 of 32 slices shown]
[im 1/32]
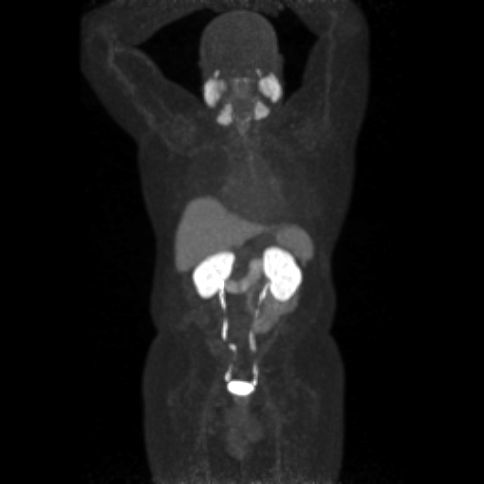

[25 of 25 positions shown; findings below may reference images not displayed]

FINDINGS: NECK

No radiotracer activity in neck lymph nodes.

Incidental CT finding: None

CHEST

No radiotracer accumulation within mediastinal or hilar lymph nodes.
No suspicious pulmonary nodules on the CT scan.

Incidental CT finding: None

ABDOMEN/PELVIS

Prostate: Focal activity within the entire posterior prostate gland
more at the base and mid gland grossly by PET CT with a maximum SUV
of 5.6.

Lymph nodes: No signs of nodal disease no focal areas of radiotracer
accumulation in the retroperitoneum.

Liver: No evidence of liver metastasis

Incidental CT finding:

Smooth hepatic contours. Cholecystectomy changes. No acute findings
relative to pancreas, spleen, adrenal glands, kidneys or urinary
tract. Gastrointestinal tract without acute findings. Colonic
diverticulosis. No adenopathy by size criteria in the abdomen or
pelvis. Scattered aortic atherosclerosis in the abdominal aorta.

Mild stranding about the LEFT and RIGHT flank along the lower
abdomen in the RIGHT and LEFT lower quadrants. No associated gas. No
overlying skin thickening grossly. Mild radiotracer accumulation in
these areas.

SKELETON

No focal activity to indicate skeletal metastatic disease. No signs
of bony sclerosis with focal features.
IMPRESSION: No signs of radiotracer accumulation beyond the prostate which shows
evidence of PSMA accumulation in the posterior gland and moderate
activity likely the site of the patient's reported prostate cancer.

Stranding in the bilateral anterior abdomen over the RIGHT LEFT
lower quadrant likely represent site of medication injection.
Correlate with any localizing symptoms to this area or overlying
skin thickening.

Aortic atherosclerosis.

Aortic Atherosclerosis ([id number]-TH6.6).

## 2024-02-19 ENCOUNTER — Other Ambulatory Visit: Payer: Self-pay

## 2024-02-19 ENCOUNTER — Emergency Department (HOSPITAL_COMMUNITY)

## 2024-02-19 ENCOUNTER — Emergency Department (HOSPITAL_COMMUNITY)
Admit: 2024-02-19 | Discharge: 2024-02-19 | Disposition: A | Discharge status: Home / Self Care | Attending: Emergency Medicine | Admitting: Emergency Medicine

## 2024-02-19 ENCOUNTER — Emergency Department (HOSPITAL_COMMUNITY)
Admission: EM | Admit: 2024-02-19 | Discharge: 2024-02-19 | Disposition: A | Discharge status: Home / Self Care | Attending: Emergency Medicine | Admitting: Emergency Medicine

## 2024-02-19 ENCOUNTER — Encounter (HOSPITAL_COMMUNITY): Payer: Self-pay

## 2024-02-19 DIAGNOSIS — Z79899 Other long term (current) drug therapy: Secondary | ICD-10-CM | POA: Diagnosis not present

## 2024-02-19 DIAGNOSIS — Z8546 Personal history of malignant neoplasm of prostate: Secondary | ICD-10-CM | POA: Insufficient documentation

## 2024-02-19 DIAGNOSIS — R600 Localized edema: Secondary | ICD-10-CM | POA: Diagnosis not present

## 2024-02-19 DIAGNOSIS — I10 Essential (primary) hypertension: Secondary | ICD-10-CM | POA: Diagnosis not present

## 2024-02-19 DIAGNOSIS — M7989 Other specified soft tissue disorders: Secondary | ICD-10-CM

## 2024-02-19 LAB — CBC WITH DIFFERENTIAL/PLATELET
Abs Immature Granulocytes: 0.03 10*3/uL (ref 0.00–0.07)
Basophils Absolute: 0 10*3/uL (ref 0.0–0.1)
Basophils Relative: 0 %
Eosinophils Absolute: 0.1 10*3/uL (ref 0.0–0.5)
Eosinophils Relative: 3 %
HCT: 38.9 % — ABNORMAL LOW (ref 39.0–52.0)
Hemoglobin: 11.8 g/dL — ABNORMAL LOW (ref 13.0–17.0)
Immature Granulocytes: 1 %
Lymphocytes Relative: 22 %
Lymphs Abs: 1 10*3/uL (ref 0.7–4.0)
MCH: 26 pg (ref 26.0–34.0)
MCHC: 30.3 g/dL (ref 30.0–36.0)
MCV: 85.7 fL (ref 80.0–100.0)
Monocytes Absolute: 0.4 10*3/uL (ref 0.1–1.0)
Monocytes Relative: 9 %
Neutro Abs: 3 10*3/uL (ref 1.7–7.7)
Neutrophils Relative %: 65 %
Platelets: 286 10*3/uL (ref 150–400)
RBC: 4.54 MIL/uL (ref 4.22–5.81)
RDW: 14.1 % (ref 11.5–15.5)
WBC: 4.7 10*3/uL (ref 4.0–10.5)
nRBC: 0 % (ref 0.0–0.2)

## 2024-02-19 LAB — COMPREHENSIVE METABOLIC PANEL WITH GFR
ALT: 25 U/L (ref 0–44)
AST: 23 U/L (ref 15–41)
Albumin: 4.1 g/dL (ref 3.5–5.0)
Alkaline Phosphatase: 87 U/L (ref 38–126)
Anion gap: 9 (ref 5–15)
BUN: 17 mg/dL (ref 8–23)
CO2: 24 mmol/L (ref 22–32)
Calcium: 9.2 mg/dL (ref 8.9–10.3)
Chloride: 102 mmol/L (ref 98–111)
Creatinine, Ser: 1.3 mg/dL — ABNORMAL HIGH (ref 0.61–1.24)
GFR, Estimated: >60 mL/min (ref >60)
Glucose, Bld: 129 mg/dL — ABNORMAL HIGH (ref 70–99)
Potassium: 3.7 mmol/L (ref 3.5–5.1)
Sodium: 135 mmol/L (ref 135–145)
Total Bilirubin: 0.7 mg/dL (ref 0.0–1.2)
Total Protein: 7.5 g/dL (ref 6.5–8.1)

## 2024-02-19 LAB — TROPONIN I (HIGH SENSITIVITY): Troponin I (High Sensitivity): 5 ng/L (ref <18)

## 2024-02-19 LAB — BRAIN NATRIURETIC PEPTIDE: B Natriuretic Peptide: 13 pg/mL (ref 0.0–100.0)

## 2024-02-19 MED ORDER — FUROSEMIDE 20 MG PO TABS: 20.0000 mg | ORAL_TABLET | Freq: Every day | ORAL | 0 refills | Status: AC

## 2024-02-19 NOTE — Progress Notes (Signed)
 Bilateral lower extremity venous duplex has been completed. Preliminary results can be found in CV Proc through chart review.  Results were given to Dr. Randol.  02/19/24 4:10 PM Cathlyn Collet RVT

## 2024-02-19 NOTE — ED Triage Notes (Signed)
 Lower leg swelling for the past two weeks. Pt states he was taken off he prednisone  and was told he might have some swelling from that. Pt is having pain in bilateral ankles and calf. Pt denies any chest pain or sob.

## 2024-02-19 NOTE — ED Provider Notes (Signed)
 [place] EMERGENCY DEPARTMENT AT [facility] Provider Note   CSN: [id number] Arrival date & time: 02/19/24  1316     Patient presents with: Leg Swelling   Blake Wolfe is a 63 y.o. male.   HPI Pt has history of prostate ca, htn, anxiety.  Pt states he has been having swelling in his ankles and lower legs for the last couple of weeks.  Pt had been taken off prednisone  and noticed that it seem to start after that.  Pt had been on steroids for his prostate.  He was taken it with the cancer treatment.  No fevers.  No breathing trouble.  Pt states he had some pain in his chest yesterday.    Prior to Admission medications   Medication Sig Start Date End Date Taking? Authorizing Provider  furosemide (LASIX) 20 MG tablet Take 1 tablet (20 mg total) by mouth daily. 02/19/24  Yes Randol Simmonds, MD  abiraterone acetate (ZYTIGA) 250 MG tablet TAKE FOUR TABLETS BY MOUTH DAILY FOR PROSTATE 12/10/21   [provider]  amLODipine  (NORVASC ) 10 MG tablet Take 1 tablet (10 mg total) by mouth daily. 07/06/19 04/30/22  Lonni Slain, MD  Calcium Carb-Cholecalciferol 600-10 MG-MCG TABS Take 1 tablet by mouth daily. 12/10/21   [provider]  methocarbamol (ROBAXIN) 500 MG tablet TAKE ONE TABLET BY MOUTH THREE TIMES A DAY FOR BACK PAIN 12/15/21   [provider]  omeprazole (PRILOSEC) 20 MG capsule TAKE ONE CAPSULE BY MOUTH DAILY FOR ACID REFLUX (TAKE ON AN EMPTY STOMACH 30 MINUTES PRIOR TO A MEAL) 10/24/21   [provider]  ondansetron  (ZOFRAN ) 8 MG tablet as needed. 12/15/21   [provider]  predniSONE  (DELTASONE ) 5 MG tablet Take 1 tablet by mouth daily. 12/10/21   [provider]  tamsulosin  (FLOMAX ) 0.4 MG CAPS capsule Take 1 capsule (0.4 mg total) by mouth daily as needed. For urinary hesitancy / frequency 04/30/22   Manny, Ricardo KATHEE Raddle., MD  traMADol  (ULTRAM ) 50 MG tablet Take 1-2 tablets (50-100 mg total) by mouth every 6 (six) hours  as needed for moderate pain or severe pain. Post-operatively 04/30/22   Alvaro Ricardo KATHEE Raddle., MD    Allergies: Patient has no known allergies.    Review of Systems  Updated Vital Signs BP (!) 142/83   Pulse 73   Temp 97.8 F (36.6 C) (Oral)   Resp 20   Ht 1.727 m (5' 8)   Wt 102.1 kg   SpO2 100%   BMI 34.21 kg/m   Physical Exam Vitals and nursing note reviewed.  Constitutional:      General: He is not in acute distress.    Appearance: He is well-developed.  HENT:     Head: Normocephalic and atraumatic.     Right Ear: External ear normal.     Left Ear: External ear normal.   Eyes:     General: No scleral icterus.       Right eye: No discharge.        Left eye: No discharge.     Conjunctiva/sclera: Conjunctivae normal.   Neck:     Trachea: No tracheal deviation.   Cardiovascular:     Rate and Rhythm: Normal rate and regular rhythm.  Pulmonary:     Effort: Pulmonary effort is normal. No respiratory distress.     Breath sounds: Normal breath sounds. No stridor. No wheezing or rales.  Abdominal:     General: Bowel sounds are normal. There  is no distension.     Palpations: Abdomen is soft.     Tenderness: There is no abdominal tenderness. There is no guarding or rebound.   Musculoskeletal:        General: No tenderness or deformity.     Cervical back: Neck supple.     Right lower leg: Edema present.     Left lower leg: Edema present.   Skin:    General: Skin is warm and dry.     Findings: No rash.   Neurological:     General: No focal deficit present.     Mental Status: He is alert.     Cranial Nerves: No cranial nerve deficit, dysarthria or facial asymmetry.     Sensory: No sensory deficit.     Motor: No abnormal muscle tone or seizure activity.     Coordination: Coordination normal.   Psychiatric:        Mood and Affect: Mood normal.     (all labs ordered are listed, but only abnormal results are displayed) Labs Reviewed  CBC WITH  DIFFERENTIAL/PLATELET - Abnormal; Notable for the following components:      Result Value   Hemoglobin 11.8 (*)    HCT 38.9 (*)    All other components within normal limits  COMPREHENSIVE METABOLIC PANEL WITH GFR - Abnormal; Notable for the following components:   Glucose, Bld 129 (*)    Creatinine, Ser 1.30 (*)    All other components within normal limits  BRAIN NATRIURETIC PEPTIDE  TROPONIN I (HIGH SENSITIVITY)    EKG: EKG Interpretation Date/Time:  Sunday February 19 2024 15:36:53 EDT Ventricular Rate:  64 PR Interval:  167 QRS Duration:  88 QT Interval:  405 QTC Calculation: 418 R Axis:   -7  Text Interpretation: Sinus rhythm No significant change since last tracing Confirmed by Randol Simmonds [phone]) on 02/19/2024 4:08:02 PM  Radiology: VAS US  LOWER EXTREMITY VENOUS (DVT) (7a-7p) Result Date: 02/19/2024  Lower Venous DVT Study Patient Name:  Blake Wolfe  Date of Exam:   02/19/2024 Medical Rec #: [id number]         Accession #:    [id number] Date of Birth: Jun 01, 1961         Patient Gender: M Patient Age:   74 years Exam Location:  [facility] Procedure:      VAS US  LOWER EXTREMITY VENOUS (DVT) Referring Phys: Lakeitha Basques --------------------------------------------------------------------------------  Indications: Swelling.  Risk Factors: None identified. Limitations: Poor ultrasound/tissue interface. Comparison Study: No prior studies. Performing Technologist: Cordella Collet RVT  Examination Guidelines: A complete evaluation includes B-mode imaging, spectral Doppler, color Doppler, and power Doppler as needed of all accessible portions of each vessel. Bilateral testing is considered an integral part of a complete examination. Limited examinations for reoccurring indications may be performed as noted. The reflux portion of the exam is performed with the patient in reverse Trendelenburg.  +---------+---------------+---------+-----------+----------+--------------+ RIGHT     CompressibilityPhasicitySpontaneityPropertiesThrombus Aging +---------+---------------+---------+-----------+----------+--------------+ CFV      Full           Yes      Yes                                 +---------+---------------+---------+-----------+----------+--------------+ SFJ      Full                                                        +---------+---------------+---------+-----------+----------+--------------+  FV Prox  Full                                                        +---------+---------------+---------+-----------+----------+--------------+ FV Mid   Full                                                        +---------+---------------+---------+-----------+----------+--------------+ FV DistalFull                                                        +---------+---------------+---------+-----------+----------+--------------+ PFV      Full                                                        +---------+---------------+---------+-----------+----------+--------------+ POP      Full           Yes      Yes                                 +---------+---------------+---------+-----------+----------+--------------+ PTV      Full                                                        +---------+---------------+---------+-----------+----------+--------------+ PERO     Full                                                        +---------+---------------+---------+-----------+----------+--------------+   +---------+---------------+---------+-----------+----------+--------------+ LEFT     CompressibilityPhasicitySpontaneityPropertiesThrombus Aging +---------+---------------+---------+-----------+----------+--------------+ CFV      Full           Yes      Yes                                 +---------+---------------+---------+-----------+----------+--------------+ SFJ      Full                                                         +---------+---------------+---------+-----------+----------+--------------+ FV Prox  Full                                                        +---------+---------------+---------+-----------+----------+--------------+  FV Mid   Full                                                        +---------+---------------+---------+-----------+----------+--------------+ FV DistalFull                                                        +---------+---------------+---------+-----------+----------+--------------+ PFV      Full                                                        +---------+---------------+---------+-----------+----------+--------------+ POP      Full           Yes      Yes                                 +---------+---------------+---------+-----------+----------+--------------+ PTV      Full                                                        +---------+---------------+---------+-----------+----------+--------------+ PERO     Full                                                        +---------+---------------+---------+-----------+----------+--------------+     Summary: RIGHT: - There is no evidence of deep vein thrombosis in the lower extremity.  - No cystic structure found in the popliteal fossa.  LEFT: - There is no evidence of deep vein thrombosis in the lower extremity.  - No cystic structure found in the popliteal fossa.  *See table(s) above for measurements and observations.    Preliminary    DG Chest 2 View Result Date: 02/19/2024 CLINICAL DATA:  Leg swelling with chest pain EXAM: CHEST - 2 VIEW COMPARISON:  Chest x-ray 10/03/2019 FINDINGS: The heart size and mediastinal contours are within normal limits. Both lungs are clear. There are multiple healed right-sided rib fractures. IMPRESSION: No active cardiopulmonary disease. Electronically Signed   By: Greig Pique M.D.   On: 02/19/2024 15:54     Procedures    Medications Ordered in the ED - No data to display  Clinical Course as of 02/19/24 1938  Sun Feb 19, 2024  1609 Doppler study negative for DVT [JK]    Clinical Course User Index [JK] Randol Simmonds, MD                                 Medical Decision Making Problems Addressed: Peripheral edema: acute illness or injury that poses a threat to life or bodily functions  Amount and/or Complexity of Data Reviewed Labs: ordered. Decision-making details documented in ED Course. Radiology: ordered and independent interpretation performed.  Risk Prescription drug management.   Patient presents ED with complaints of swelling in his lower extremities.  Exam without signs of erythema or ulceration.  No findings to suggest infection.  Consider the possibility of deep venous thrombosis.  Ultrasound was performed and fortunately no signs of DVT.  X-ray did not show any signs of pulmonary edema.  Patient has normal troponins and BNP.  No signs of CHF exacerbation.  No signs of severe anemia.  No signs of acute renal dysfunction.  Suspect mild peripheral edema.  Will have him try a short course of diuretics.  Discussed use of compression stockings.  Will have him follow-up with his PCP as it is also possible the Norvasc  could be contributing to his edema.       Final diagnoses:  Peripheral edema    ED Discharge Orders          Ordered    furosemide (LASIX) 20 MG tablet  Daily        02/19/24 1936               Randol Simmonds, MD 02/19/24 1940

## 2024-02-19 NOTE — Discharge Instructions (Addendum)
 Take the diuretic medication in the morning to help with your leg swelling.  It will cause you to urinate more frequently after you take the medication.  Follow-up with your primary care doctor to be rechecked this coming week.  Return to the ED for shortness of breath fever chills or other concerning symptoms
